# Patient Record
Sex: Male | Born: 1960 | Race: White | Hispanic: No | Marital: Married | Smoking: Former smoker
Health system: Southern US, Community
[De-identification: ages and names within clinical notes are randomized; demographics above are authoritative.]

## PROBLEM LIST (undated history)

## (undated) DIAGNOSIS — M1990 Unspecified osteoarthritis, unspecified site: Secondary | ICD-10-CM

## (undated) DIAGNOSIS — E0590 Thyrotoxicosis, unspecified without thyrotoxic crisis or storm: Secondary | ICD-10-CM

## (undated) DIAGNOSIS — I10 Essential (primary) hypertension: Secondary | ICD-10-CM

## (undated) DIAGNOSIS — K5909 Other constipation: Secondary | ICD-10-CM

## (undated) DIAGNOSIS — K5900 Constipation, unspecified: Secondary | ICD-10-CM

## (undated) HISTORY — DX: Thyrotoxicosis, unspecified without thyrotoxic crisis or storm: E05.90

## (undated) HISTORY — PX: FOOT SURGERY: SHX648

## (undated) HISTORY — PX: OTHER SURGICAL HISTORY: SHX169

## (undated) HISTORY — DX: Unspecified osteoarthritis, unspecified site: M19.90

## (undated) HISTORY — DX: Constipation, unspecified: K59.00

## (undated) HISTORY — DX: Essential (primary) hypertension: I10

## (undated) HISTORY — PX: UPPER GASTROINTESTINAL ENDOSCOPY: SHX188

## (undated) HISTORY — DX: Other constipation: K59.09

## (undated) HISTORY — PX: HIATAL HERNIA REPAIR: SHX195

## (undated) HISTORY — PX: ABDOMINAL HERNIA REPAIR: SHX539

---

## 1998-07-01 ENCOUNTER — Inpatient Hospital Stay (HOSPITAL_COMMUNITY): Admission: RE | Admit: 1998-07-01 | Discharge: 1998-07-02 | Payer: Self-pay | Admitting: Neurosurgery

## 2002-11-15 ENCOUNTER — Observation Stay (HOSPITAL_COMMUNITY): Admission: EM | Admit: 2002-11-15 | Discharge: 2002-11-16 | Payer: Self-pay | Admitting: Internal Medicine

## 2002-11-15 ENCOUNTER — Encounter: Payer: Self-pay | Admitting: *Deleted

## 2002-11-15 ENCOUNTER — Encounter: Payer: Self-pay | Admitting: Internal Medicine

## 2002-12-19 ENCOUNTER — Ambulatory Visit (HOSPITAL_COMMUNITY): Admission: RE | Admit: 2002-12-19 | Discharge: 2002-12-19 | Payer: Self-pay | Admitting: Internal Medicine

## 2003-03-13 ENCOUNTER — Ambulatory Visit (HOSPITAL_COMMUNITY): Admission: RE | Admit: 2003-03-13 | Discharge: 2003-03-13 | Payer: Self-pay | Admitting: Internal Medicine

## 2003-03-15 ENCOUNTER — Ambulatory Visit (HOSPITAL_COMMUNITY): Admission: RE | Admit: 2003-03-15 | Discharge: 2003-03-15 | Payer: Self-pay | Admitting: Internal Medicine

## 2003-04-02 ENCOUNTER — Ambulatory Visit (HOSPITAL_BASED_OUTPATIENT_CLINIC_OR_DEPARTMENT_OTHER): Admission: RE | Admit: 2003-04-02 | Discharge: 2003-04-02 | Payer: Self-pay | Admitting: Internal Medicine

## 2006-08-05 ENCOUNTER — Ambulatory Visit: Admission: RE | Admit: 2006-08-05 | Discharge: 2006-08-05 | Payer: Self-pay | Admitting: Orthopedic Surgery

## 2009-07-01 ENCOUNTER — Inpatient Hospital Stay (HOSPITAL_COMMUNITY): Admission: EM | Admit: 2009-07-01 | Discharge: 2009-07-05 | Payer: Self-pay | Admitting: Emergency Medicine

## 2009-11-03 ENCOUNTER — Ambulatory Visit (HOSPITAL_COMMUNITY): Admission: RE | Admit: 2009-11-03 | Discharge: 2009-11-03 | Payer: Self-pay | Admitting: Internal Medicine

## 2011-03-06 LAB — BASIC METABOLIC PANEL
BUN: 10 mg/dL (ref 6–23)
CO2: 25 mEq/L (ref 19–32)
Calcium: 9.2 mg/dL (ref 8.4–10.5)
GFR calc non Af Amer: >60 mL/min (ref >60)
Glucose, Bld: 84 mg/dL (ref 70–99)

## 2011-03-06 LAB — CBC
HCT: 42.9 % (ref 39.0–52.0)
HCT: 47.8 % (ref 39.0–52.0)
Hemoglobin: 16.1 g/dL (ref 13.0–17.0)
MCHC: 33.6 g/dL (ref 30.0–36.0)
MCHC: 33.9 g/dL (ref 30.0–36.0)
Platelets: 180 10*3/uL (ref 150–400)
Platelets: 197 10*3/uL (ref 150–400)
Platelets: 231 10*3/uL (ref 150–400)
RDW: 13.4 % (ref 11.5–15.5)
RDW: 13.7 % (ref 11.5–15.5)
RDW: 13.7 % (ref 11.5–15.5)
WBC: 13.3 10*3/uL — ABNORMAL HIGH (ref 4.0–10.5)

## 2011-03-06 LAB — CREATININE, SERUM
Creatinine, Ser: 0.99 mg/dL (ref 0.4–1.5)
GFR calc non Af Amer: >60 mL/min (ref >60)

## 2011-03-06 LAB — DIFFERENTIAL
Basophils Absolute: 0.1 10*3/uL (ref 0.0–0.1)
Basophils Relative: 1 % (ref 0–1)
Eosinophils Relative: 1 % (ref 0–5)
Lymphocytes Relative: 23 % (ref 12–46)
Monocytes Absolute: 0.8 10*3/uL (ref 0.1–1.0)
Neutro Abs: 6.4 10*3/uL (ref 1.7–7.7)

## 2011-03-06 LAB — POTASSIUM: Potassium: 4.2 mEq/L (ref 3.5–5.1)
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
## 2011-04-13 NOTE — Op Note (Signed)
[name] [name] NO.:  [id number]   MEDICAL RECORD NO.:  [id number]          PATIENT TYPE:  INP   LOCATION:  0098                         FACILITY:  [facility]   PHYSICIAN:  [name] Sportsman, MD     DATE OF BIRTH:  May 28, 1961   DATE OF PROCEDURE:  DATE OF DISCHARGE:                               OPERATIVE REPORT   PRIMARY CARE PHYSICIAN:  [name] Callander. Ouida Sills, MD, [name] [name].   SURGEON:  [name] Sportsman, MD.   ASSISTANT:  RN.   PREOPERATIVE DIAGNOSIS:  Incarcerated ventral hernia.   POSTOPERATIVE DIAGNOSES:  Incarcerated ventral hernia and a  periumbilical hernia as well, (5 x 11 cm region).   PROCEDURE PERFORMED:  1. Reduction of incarcerated ventral hernia.  2. Laparoscopic primary closure of ventral hernia.  3. Laparoscopic repair of ventral hernia using a 15 x 20 cm      Parietex/Seprafilm dual sided mesh.   ANESTHESIA:  1. General anesthesia.  2. Local anesthetic in a field block around all port sites and fascial      sites.   SPECIMENS:  None.   DRAINS:  None.   ESTIMATED BLOOD LOSS:  Thirty mL.   COMPLICATIONS:  None major.   INDICATIONS:  [name] [name] is a 50 year old morbidly obese male who has  had a supraumbilical ventral hernia for the past year.  It has increased  in size and has been coming more intermittently symptomatic.  He was  scheduled to have surgery and to be seen by Dr. Luretha Murphy later  this month in our group when he developed severe pain.  It persisted for  48 hours.  He followed up with his primary care physician.  Dr. Ouida [name]  was concerned about an unreducible hernia and, therefore, requested  surgical evaluation.  I was not able to reduce it myself.   The anatomy and embryology of the abdominal formation was discussed.  Pathophysiology of herniation was discussed.  Options discussed and  recommendations made for laparoscopic, possible open exploration with  reduction of hernia.  The possible need for bowel resection was  discussed.  The need for possible mesh reinforcement repair was  discussed.  The risks, benefits and alternatives discussed.  Questions  answered and he and his wife agreed to proceed.   OPERATIVE FINDINGS:  He had two ventral hernias, one periumbilical and  once supraumbilical for a total of 5 x 11 cm region.  The more cephalad  one was incarcerated with a large wad of omentum, but not strangulated.  The umbilical one was able to be reduced as well.  There was no evidence  of any intra-abdominal ischemia or necrosis.   DESCRIPTION OF PROCEDURE:  Informed consent was confirmed.  The patient  received IV cefazolin and Flagyl prior to incision.  He voided just  prior to going to the operating room.  He had sequential compression  devices active during the entire case.  He underwent general anesthesia  without any difficulty.  He was positioned supine with both arms tucked.  His abdomen was clipped, prepped and draped in a sterile fashion.  A  surgical timeout confirmed our plan.   I placed a #5 mm port in the left upper quadrant with the patient in  steep reversed Trendelenburg and left-side up.  A 5-mm port was placed  in the left flank and a 10-mm port was placed in the left lower  quadrant.   Camera inspection revealed a large swath of omentum going up into the  midline.  With insufflation and some careful palpation of it, I was  gradually able to get it reduced in through external pressure and  internal traction.  I inspected it and it was all omentum and although  the transverse colon was close to it, it was not actually involved  within it.  The colon had a little bit of inflammation, but showed no  evidence of any infection or necrosis.   I inspected the rest of the abdomen and there was no evidence of any  bowel obstruction or any other problems elsewhere.  I did notice a  hernia through the umbilicus inferiorly as well.  I took down the  falciform ligament since it was going  into the larger supraventricular  ventral hernia and freed it off from the umbilicus all the way to the  liver edge and removed it.   The hernia defect was measured.  I closed the supraventricular defect  using #1 Novofil interrupted stitches using a laparoscopic suture passer  by placing them and then tying them down with pressure released.  That  brought good approximation of the umbilical hernia.  The periumbilical  hernias, I did not more aggressively close since they were smaller.  I  chose a 50 x 20 cm mesh.  I tacked through the fascia of the anterior  abdominal wall using 10 interrupted #1 Novofil stitches using a  laparoscopic suture passer.  I used a tacker to tack the rim, as well as  the central part of the mesh to help pull it in place well.  This was  done with insufflation turned down to 10 mmHg.  Inspection revealed good  overlay with it nice and snug, but not over-tense.  I inspected the  omentum and it had perked up and looked much happier overall.  I closed  the 10 mm port using #1 Novofil interrupted stitches x2 using  laparoscopic suture passer.  Capnoperitoneum was evacuated and ports  removed.  The skin was closed using a 4-0 Monocryl stitch.  The smaller  puncture sites were closed using Steri-Strips.  Sterile dressings  applied.  The patient was extubated and sent to the recovery room in  stable condition.   I discussed postoperative care with the patient's wife.      [name] Sportsman, MD  Electronically Signed     SCG/MEDQ  D:  07/01/2009  T:  07/02/2009  Job:  [id number]   cc:   [name] Callander. Ouida Sills, MD  Fax: [phone]

## 2012-01-21 ENCOUNTER — Other Ambulatory Visit (INDEPENDENT_AMBULATORY_CARE_PROVIDER_SITE_OTHER): Payer: Self-pay | Admitting: *Deleted

## 2012-01-21 MED ORDER — LUBIPROSTONE 8 MCG PO CAPS: 8.0000 ug | ORAL_CAPSULE | Freq: Two times a day (BID) | ORAL | Status: AC

## 2012-01-21 NOTE — Telephone Encounter (Signed)
Patient has requested a refill on Amitiza 8 mcg capsule. Take 1 by mouth three times daily.

## 2012-10-04 ENCOUNTER — Ambulatory Visit (INDEPENDENT_AMBULATORY_CARE_PROVIDER_SITE_OTHER): Payer: BC Managed Care – PPO | Admitting: Internal Medicine

## 2012-10-04 ENCOUNTER — Encounter (INDEPENDENT_AMBULATORY_CARE_PROVIDER_SITE_OTHER): Payer: Self-pay | Admitting: Internal Medicine

## 2012-10-04 VITALS — BP 104/66 | HR 64 | Temp 98.2°F | Ht 75.0 in | Wt 325.9 lb

## 2012-10-04 DIAGNOSIS — K5900 Constipation, unspecified: Secondary | ICD-10-CM

## 2012-10-04 MED ORDER — LUBIPROSTONE 24 MCG PO CAPS: 24.0000 ug | ORAL_CAPSULE | Freq: Two times a day (BID) | ORAL | Status: DC

## 2012-10-04 NOTE — Patient Instructions (Addendum)
Continue present medication. OV in 1 yr. He will call when he is ready to have a colonoscopy

## 2012-10-04 NOTE — Progress Notes (Signed)
Subjective:     Patient ID: [name] [name], male   DOB: 16-Jan-1961, 51 y.o.   MRN: [id number]  HPI Here today for f/u of his constipation.  He tells me as long as he takes the Amitiza and MIialax he is not constipated. He has a BM daily.  He does not see blood or melena. Appetite is good.  No weight loss.  He is not exercising. He tells me he had a hernia repair in 2010 ago by Dr. Michaell Cowing.for an incarcerated ventral hernia.   Review of Systems see hpi Current Outpatient Prescriptions  Medication Sig Dispense Refill  . aspirin 81 MG tablet Take 81 mg by mouth daily.      . celecoxib (CELEBREX) 200 MG capsule Take 200 mg by mouth daily.      . cyclobenzaprine (FLEXERIL) 10 MG tablet Take 10 mg by mouth as needed.      . gabapentin (NEURONTIN) 100 MG capsule Take 100 mg by mouth at bedtime as needed and may repeat dose one time if needed.      . lubiprostone (AMITIZA) 8 MCG capsule Take 8 mcg by mouth 2 (two) times daily with a meal.      . TraMADol HCl 50 MG TBDP Take by mouth daily.      Marland Kitchen lubiprostone (AMITIZA) 24 MCG capsule Take 1 capsule (24 mcg total) by mouth 2 (two) times daily with a meal.  60 capsule  11   Past Medical History  Diagnosis Date  . Constipation    Past Surgical History  Procedure Date  . Knee surgeries   . Neck surgeries    History   Social History  . Marital Status: Married    Spouse Name: N/A    Number of Children: N/A  . Years of Education: N/A   Occupational History  . Not on file.   Social History Main Topics  . Smoking status: Never Smoker   . Smokeless tobacco: Not on file  . Alcohol Use: No  . Drug Use: No  . Sexually Active: Not on file   Other Topics Concern  . Not on file   Social History Narrative  . No narrative on file   Family Status  Relation Status Death Age  . Mother Deceased     unknown ? CVA or MI  . Father Deceased     Cirrhosis  . Sister Alive     unknown   Allergies  Allergen Reactions  . Morphine And Related          Objective:   Physical Exam Filed Vitals:   10/04/12 0953  BP: 104/66  Pulse: 64  Temp: 98.2 F (36.8 C)  Height: 6\' 3"  (1.905 m)  Weight: 325 lb 14.4 oz (147.827 kg)  Alert and oriented. Skin warm and dry. Oral mucosa is moist.   . Sclera anicteric, conjunctivae is pink. Thyroid not enlarged. No cervical lymphadenopathy. Lungs clear. Heart regular rate and rhythm.  Abdomen is soft. Bowel sounds are positive. No hepatomegaly. ? Mess felt at umblicus from ventral hernia repair.  felt. No tenderness.  No edema to lower extremities.        Assessment:    Constipation.  Much better with Amitiza and Miralax. Having a stool a day.     Plan:     OV in 1 yr. Continue present medications. He will call when he is ready to have a screening colonoscopy.

## 2012-10-05 ENCOUNTER — Other Ambulatory Visit (INDEPENDENT_AMBULATORY_CARE_PROVIDER_SITE_OTHER): Payer: Self-pay | Admitting: Internal Medicine

## 2012-12-25 ENCOUNTER — Telehealth (INDEPENDENT_AMBULATORY_CARE_PROVIDER_SITE_OTHER): Payer: Self-pay | Admitting: Internal Medicine

## 2012-12-25 DIAGNOSIS — K5900 Constipation, unspecified: Secondary | ICD-10-CM

## 2012-12-25 MED ORDER — LUBIPROSTONE 24 MCG PO CAPS: 24.0000 ug | ORAL_CAPSULE | Freq: Every day | ORAL | Status: DC

## 2012-12-25 MED ORDER — LUBIPROSTONE 8 MCG PO CAPS: 8.0000 ug | ORAL_CAPSULE | Freq: Two times a day (BID) | ORAL | Status: DC

## 2012-12-25 NOTE — Telephone Encounter (Signed)
States of Amitiza is working. Will reorder for him: eprescribed

## 2013-06-26 ENCOUNTER — Other Ambulatory Visit (INDEPENDENT_AMBULATORY_CARE_PROVIDER_SITE_OTHER): Payer: Self-pay | Admitting: Internal Medicine

## 2013-07-03 ENCOUNTER — Other Ambulatory Visit (HOSPITAL_COMMUNITY): Payer: Self-pay | Admitting: Internal Medicine

## 2013-07-03 ENCOUNTER — Ambulatory Visit (HOSPITAL_COMMUNITY)
Admission: RE | Admit: 2013-07-03 | Discharge: 2013-07-03 | Disposition: A | Discharge status: Home / Self Care | Payer: BC Managed Care – PPO | Source: Ambulatory Visit | Attending: Internal Medicine | Admitting: Internal Medicine

## 2013-07-03 DIAGNOSIS — R509 Fever, unspecified: Secondary | ICD-10-CM | POA: Insufficient documentation

## 2013-07-03 DIAGNOSIS — R05 Cough: Secondary | ICD-10-CM | POA: Insufficient documentation

## 2013-07-03 DIAGNOSIS — R059 Cough, unspecified: Secondary | ICD-10-CM | POA: Insufficient documentation

## 2013-10-08 ENCOUNTER — Encounter (INDEPENDENT_AMBULATORY_CARE_PROVIDER_SITE_OTHER): Payer: Self-pay | Admitting: Internal Medicine

## 2013-10-08 ENCOUNTER — Ambulatory Visit (INDEPENDENT_AMBULATORY_CARE_PROVIDER_SITE_OTHER): Payer: BC Managed Care – PPO | Admitting: Internal Medicine

## 2013-10-08 VITALS — BP 130/80 | HR 78 | Temp 97.7°F | Resp 18 | Ht 75.0 in | Wt 325.1 lb

## 2013-10-08 DIAGNOSIS — K5900 Constipation, unspecified: Secondary | ICD-10-CM

## 2013-10-08 DIAGNOSIS — M545 Low back pain, unspecified: Secondary | ICD-10-CM | POA: Insufficient documentation

## 2013-10-08 DIAGNOSIS — E669 Obesity, unspecified: Secondary | ICD-10-CM | POA: Insufficient documentation

## 2013-10-08 DIAGNOSIS — I10 Essential (primary) hypertension: Secondary | ICD-10-CM

## 2013-10-08 DIAGNOSIS — E039 Hypothyroidism, unspecified: Secondary | ICD-10-CM

## 2013-10-08 DIAGNOSIS — M170 Bilateral primary osteoarthritis of knee: Secondary | ICD-10-CM

## 2013-10-08 NOTE — Patient Instructions (Signed)
Consider regular exercise or swimming at least 3 times a week. Screening colonoscopy to be scheduled in February 2015.

## 2013-10-08 NOTE — Progress Notes (Signed)
Presenting complaint;  Followup for chronic constipation.  Subjective:  [name] [name] is 52 year old Caucasian male with chronic constipation and presents for yearly visit. He states medication is working. Every now and then he has to take a dose of MiraLax. He has noted that he becomes constipated when he's not very physically active and does not need the right kinds of foods. He remains with good appetite. His weight has not changed since his last visit one year ago. He denies melena or rectal bleeding. He continues to complain of bilateral knee pain as well as back pain. He has never been screened for colorectal carcinoma. He is not having any side effects with Amitiza.  Current Medications: Current Outpatient Prescriptions  Medication Sig Dispense Refill  . AMITIZA 8 MCG capsule TAKE 3 CAPSULES BY MOUTH IN THE MORNING AND 2 CAPSULES IN THE EVENING.  150 capsule  5  . aspirin 81 MG tablet Take 81 mg by mouth daily.      . celecoxib (CELEBREX) 200 MG capsule Take 200 mg by mouth daily.      . Cholecalciferol (VITAMIN D3) 5000 UNITS CAPS Take by mouth. Gel Cap - patient takes 2 by mouth daily for 5 days a week.      . cyclobenzaprine (FLEXERIL) 10 MG tablet Take 10 mg by mouth as needed.      . gabapentin (NEURONTIN) 100 MG capsule Take 200 mg by mouth at bedtime.       Marland Kitchen levothyroxine (SYNTHROID, LEVOTHROID) 112 MCG tablet Take 112 mcg by mouth daily before breakfast.      . polyethylene glycol (MIRALAX / GLYCOLAX) packet Take 17 g by mouth as needed.      Marland Kitchen PRESCRIPTION MEDICATION Patient states that he just started a blood pressure medication , takes daily.      . TraMADol HCl 50 MG TBDP Take by mouth daily.       No current facility-administered medications for this visit.     Objective: Blood pressure 130/80, pulse 78, temperature 97.7 F (36.5 C), temperature source Oral, resp. rate 18, height 6\' 3"  (1.905 m), weight 325 lb 1.6 oz (147.464 kg). Patient is alert and in no acute  distress. Conjunctiva is pink. Sclera is nonicteric Oropharyngeal mucosa is normal. No neck masses or thyromegaly noted. Cardiac exam with regular rhythm normal S1 and S2. No murmur or gallop noted. Lungs are clear to auscultation. Abdomen is protuberant, soft and nontender. He has hard subcutaneous round mass above the level of umbilicus in the midline(calcified hematoma resulting from hernia repair) Left lobe of the liver is easily palpable but it is nontender.  No LE edema or clubbing noted.    Assessment:  #1. Chronic constipation. He is doing well with therapy. Will continue with current therapy as long as it is working and he has no side effects. #2. Obesity. This condition puts patient at risk for liver disease. He must try to get it under control.  #3. Patient is average at risk for CRC. Marland Kitchen   Plan:  Continue amitiza at current dose. Continue high fiber. Patient encouraged to join [facility] or fitness club of his choice and must exercise on regular basis. Screening colonoscopy to be scheduled. Office visit in one year.

## 2013-12-29 ENCOUNTER — Other Ambulatory Visit (INDEPENDENT_AMBULATORY_CARE_PROVIDER_SITE_OTHER): Payer: Self-pay | Admitting: Internal Medicine

## 2014-01-01 ENCOUNTER — Encounter (INDEPENDENT_AMBULATORY_CARE_PROVIDER_SITE_OTHER): Payer: Self-pay | Admitting: *Deleted

## 2014-04-26 ENCOUNTER — Telehealth (INDEPENDENT_AMBULATORY_CARE_PROVIDER_SITE_OTHER): Payer: Self-pay | Admitting: *Deleted

## 2014-04-26 NOTE — Telephone Encounter (Signed)
We have done 2 PA request for the Amitza 8 mcg. [employer] states that no PA needed The sig read take 3 by mouth in the morning , 2 by mouth in the evening. I called the pharmacy spoke with Bonita Quin. [employer] will not pay for the [employer] ,and the patient is unable to use the discount card. His cost for half the prescription is $75. The patient states that he is using 24 mcg. He takes 3 by mouth in the morning , 2 in the evening. Excellent results. Prescription was changed to Amitza 24 mcg -Take 1 by mouth in the morning , and 1 by mouth in the evening #60 with 11 refills, per Urbano Heir @ [facility]. Patient will now be allowed to use the Amitza Card , co pay $35 a month.

## 2014-07-01 ENCOUNTER — Other Ambulatory Visit (INDEPENDENT_AMBULATORY_CARE_PROVIDER_SITE_OTHER): Payer: Self-pay | Admitting: Internal Medicine

## 2014-07-01 NOTE — Telephone Encounter (Signed)
Per Dr.Rehman may fill with 5 refills 

## 2014-07-17 ENCOUNTER — Encounter (INDEPENDENT_AMBULATORY_CARE_PROVIDER_SITE_OTHER): Payer: Self-pay | Admitting: *Deleted

## 2014-08-06 ENCOUNTER — Telehealth (INDEPENDENT_AMBULATORY_CARE_PROVIDER_SITE_OTHER): Payer: Self-pay | Admitting: *Deleted

## 2014-08-06 NOTE — Telephone Encounter (Addendum)
[name] [name] is needing a refill on his Amitiza. Would like it sent to his mail order pharmacy.  He is complete out and would like to know if we have any samples? The return phone number is [phone].

## 2014-08-06 NOTE — Telephone Encounter (Signed)
2 boxes of samples has been given to [place].

## 2014-08-07 ENCOUNTER — Other Ambulatory Visit (INDEPENDENT_AMBULATORY_CARE_PROVIDER_SITE_OTHER): Payer: Self-pay | Admitting: *Deleted

## 2014-08-07 MED ORDER — LUBIPROSTONE 24 MCG PO CAPS: ORAL_CAPSULE | ORAL | Status: DC

## 2014-08-07 NOTE — Telephone Encounter (Signed)
A refill request has been sent to Dr.Rehman. 

## 2014-08-07 NOTE — Telephone Encounter (Signed)
Patient called and states that he is desiring that the prescription for Amitiza 24 mcg be sent to mail order. This may be a 90 supply.

## 2014-08-20 ENCOUNTER — Telehealth (INDEPENDENT_AMBULATORY_CARE_PROVIDER_SITE_OTHER): Payer: Self-pay | Admitting: *Deleted

## 2014-08-20 NOTE — Telephone Encounter (Signed)
Patient will need to contact his Insurance Company to find out what is the cheapest. We do not know this. Patient was called and made aware.

## 2014-08-20 NOTE — Telephone Encounter (Signed)
The Amitiza is going to cost him $100 a month. Would like to see if there is something else Dr. Karilyn Cota could prescribe for him. The return phone number is [phone].

## 2014-08-23 NOTE — Telephone Encounter (Signed)
Forwarded to Dr.Rehman for review. 

## 2014-08-23 NOTE — Telephone Encounter (Signed)
[name] [name] was told by his insurance company that there are no other generic medicines covered. He should speak with his doctor. [name] [name] would like to know if Dr. Karilyn Cota could give him something else or should he schedule an apt sooner than November. His return phone number is [phone].

## 2014-08-26 NOTE — Telephone Encounter (Signed)
He could try lactulose 30 mL by mouth twice a day unless he has tried this in the past. Other option would be to take OTC laxative. Please call patient

## 2014-08-27 ENCOUNTER — Other Ambulatory Visit (INDEPENDENT_AMBULATORY_CARE_PROVIDER_SITE_OTHER): Payer: Self-pay | Admitting: *Deleted

## 2014-08-27 MED ORDER — LACTULOSE 10 GM/15ML PO SOLN: 20.0000 g | Freq: Two times a day (BID) | ORAL | Status: DC

## 2014-08-27 NOTE — Telephone Encounter (Signed)
Prescription request was sent to Dr.Rehman

## 2014-08-27 NOTE — Telephone Encounter (Signed)
Patient states that he would like to try the lactulose as recommended by Dr.Rehman.

## 2014-10-08 ENCOUNTER — Encounter (INDEPENDENT_AMBULATORY_CARE_PROVIDER_SITE_OTHER): Payer: Self-pay | Admitting: Internal Medicine

## 2014-10-08 ENCOUNTER — Telehealth (INDEPENDENT_AMBULATORY_CARE_PROVIDER_SITE_OTHER): Payer: Self-pay | Admitting: Internal Medicine

## 2014-10-08 ENCOUNTER — Ambulatory Visit (INDEPENDENT_AMBULATORY_CARE_PROVIDER_SITE_OTHER): Payer: PRIVATE HEALTH INSURANCE | Admitting: Internal Medicine

## 2014-10-08 VITALS — BP 120/66 | HR 76 | Temp 97.8°F | Ht 75.0 in | Wt 323.3 lb

## 2014-10-08 DIAGNOSIS — K5909 Other constipation: Secondary | ICD-10-CM

## 2014-10-08 DIAGNOSIS — K5900 Constipation, unspecified: Secondary | ICD-10-CM

## 2014-10-08 NOTE — Patient Instructions (Addendum)
Continue the Lactulose. Samples of Amitiza given to patient x 2 boxes.

## 2014-10-08 NOTE — Telephone Encounter (Signed)
error 

## 2014-10-08 NOTE — Progress Notes (Signed)
   Subjective:    Patient ID: [name] [name], male    DOB: Mar 28, 1961, 53 y.o.   MRN: [id number]  HPI [name] [name] today for f/u of his chronic constipation.  Presently taking Lactucose 30cc twice a day. He is having a BM at least every other day. He has actually stopped the Amitiza due to the expense. There has been no rectal bleeding. He is exercising on the job. Appetite is good. No weight loss. No melena or BRRB. Married with one child in good health. 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   Review of Systems Past Medical History  Diagnosis Date  . Constipation   . Chronic constipation   . Hypertension   . Hyperthyroidism     Past Surgical History  Procedure Laterality Date  . Knee surgeries    . Neck surgeries    . Abdominal hernia repair      Patient states that it was a double hernia surgery  . Upper gastrointestinal endoscopy  1980's  . Hiatal hernia repair      Allergies  Allergen Reactions  . Daypro [Oxaprozin] Other (See Comments)    Patient states that this medication caused bad abdominal cramping  . Morphine And Related     Current Outpatient Prescriptions on File Prior to Visit  Medication Sig Dispense Refill  . AMITIZA 8 MCG capsule TAKE 3 CAPSULES BY MOUTH IN THE MORNING AND 2 CAPSULES IN THE EVENING. 150 capsule 3  . aspirin 81 MG tablet Take 81 mg by mouth daily.    . celecoxib (CELEBREX) 200 MG capsule Take 200 mg by mouth daily.    . Cholecalciferol (VITAMIN D3) 5000 UNITS CAPS Take by mouth. One a day    . cyclobenzaprine (FLEXERIL) 10 MG tablet Take 10 mg by mouth as needed.    . gabapentin (NEURONTIN) 100 MG capsule Take 200 mg by mouth at bedtime.     Marland Kitchen. lactulose (CHRONULAC) 10 GM/15ML solution Take 30 mLs (20 g total) by mouth 2 (two) times daily. 960 mL 3  . levothyroxine (SYNTHROID, LEVOTHROID) 112 MCG tablet Take 112 mcg by mouth daily before breakfast.    . polyethylene glycol (MIRALAX / GLYCOLAX) packet Take 17 g by mouth as needed.     . TraMADol HCl 50 MG TBDP Take by mouth daily.    Marland Kitchen. PRESCRIPTION MEDICATION Patient states that he just started a blood pressure medication , takes daily.     No current facility-administered medications on file prior to visit.        Objective:   Physical Exam  Filed Vitals:   10/08/14 0911  Height: 6\' 3"  (1.905 m)  Weight: 323 lb 4.8 oz (146.648 kg)   Alert and oriented. Skin warm and dry. Oral mucosa is moist.   . Sclera anicteric, conjunctivae is pink. Thyroid not enlarged. No cervical lymphadenopathy. Lungs clear. Heart regular rate and rhythm.  Abdomen is soft. Bowel sounds are positive. No hepatomegaly. Firmness just above the umblicus. Patient is obese. No tenderness.  No edema to lower extremities.          Assessment & Plan:  Constipation, chronic. Presently taking Lactulose for his constipation with a BM every other day. In need of screening colonoscopy. Patient would like to defer colonoscopy. States he will let us know when he is ready. Encourage to exercise

## 2015-02-21 ENCOUNTER — Other Ambulatory Visit (INDEPENDENT_AMBULATORY_CARE_PROVIDER_SITE_OTHER): Payer: Self-pay | Admitting: Internal Medicine

## 2015-03-06 ENCOUNTER — Telehealth (INDEPENDENT_AMBULATORY_CARE_PROVIDER_SITE_OTHER): Payer: Self-pay | Admitting: *Deleted

## 2015-03-06 NOTE — Telephone Encounter (Signed)
Patient has been on Amitiza.Insurance will not cover this. They ask that patient try the Linzess before preceding with the PA for Amitiza. Per Dr.Rehman may give the patient sample of the Linzess 145 mcg. Patient has been called and made aware of how to take the medication.  Note only 1 box of the 145 mcg to sample, per Dr.Rehman may give samples of the . Instruct patient that if the 145 causes diarrhea he is not to take the 290. Patient was advised.

## 2015-03-08 IMAGING — CR DG CHEST 2V
3 series · 3 of 3 positions shown · non-contrast
Comparison: Acute abdominal series 07/01/2009.

CLINICAL DATA: Cough and fever.

CHEST - 2 VIEW

[view not recorded (1 of 3)]
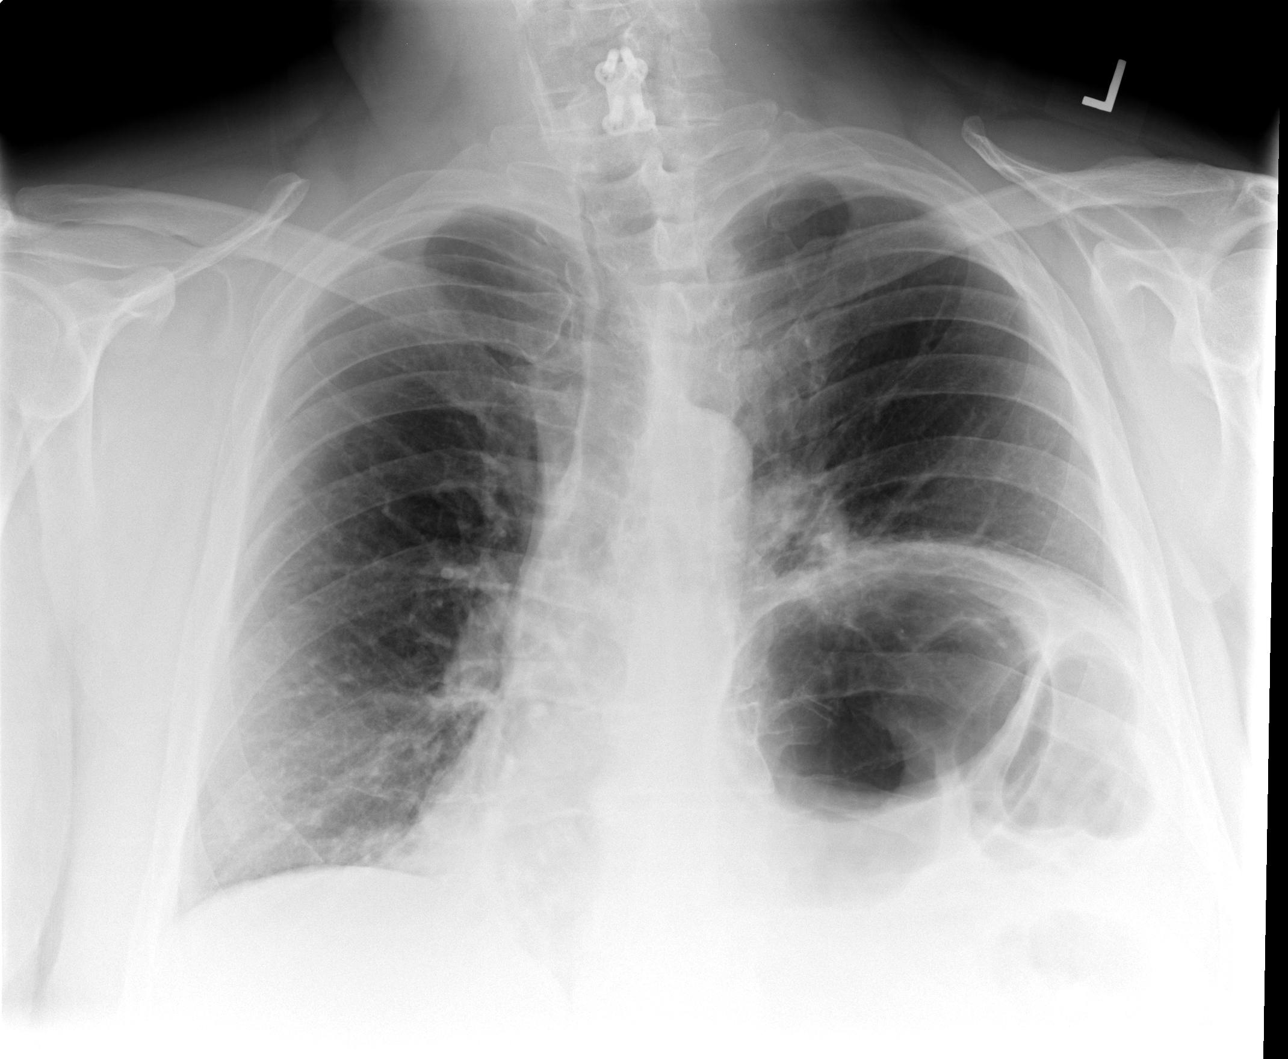

[view not recorded (2 of 3)]
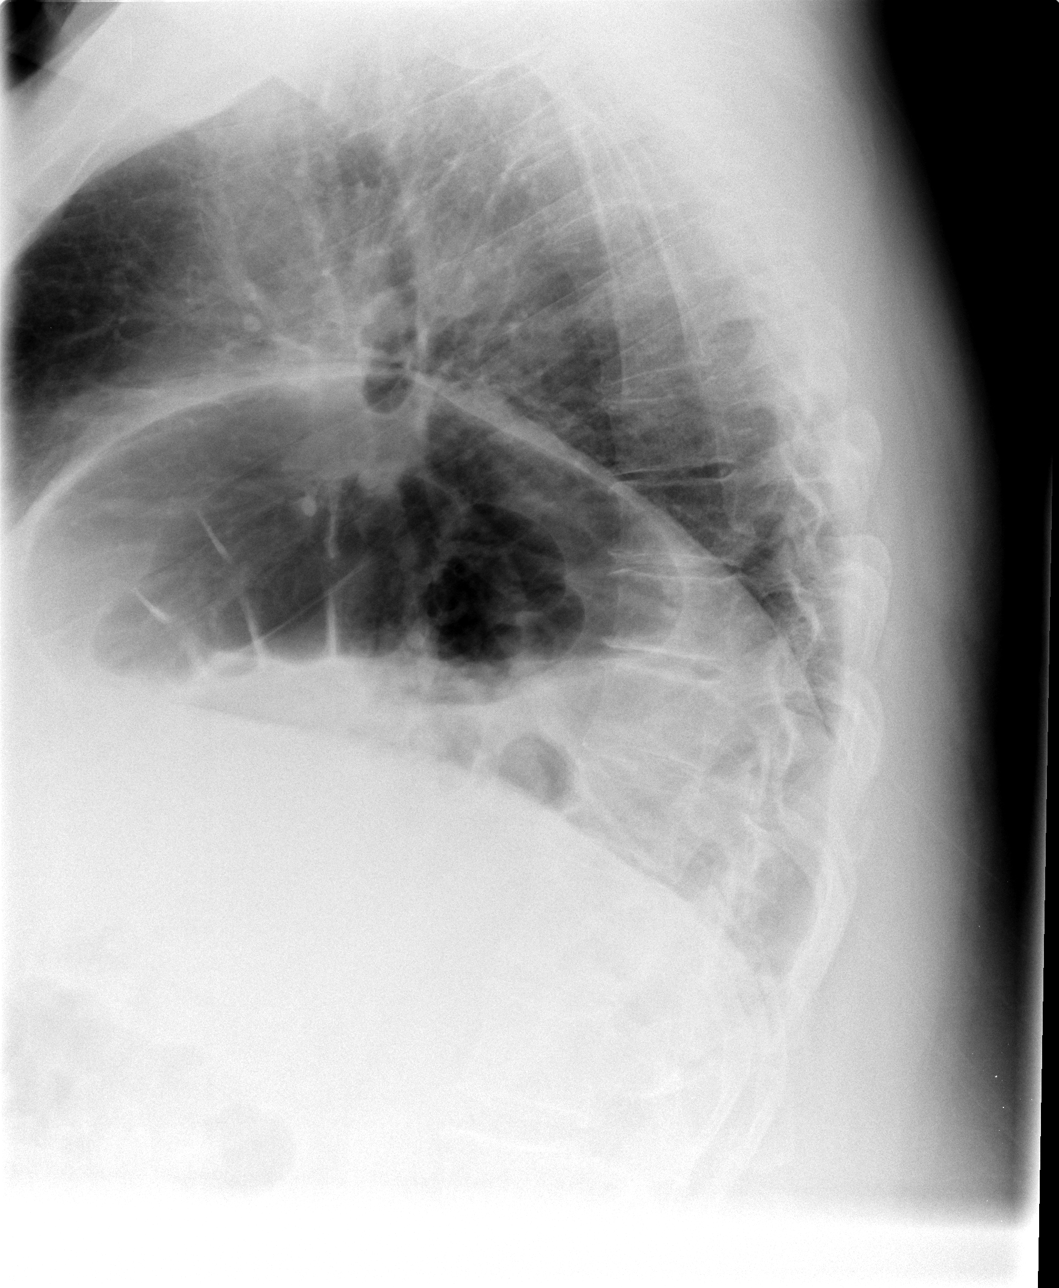

[view not recorded (3 of 3)]
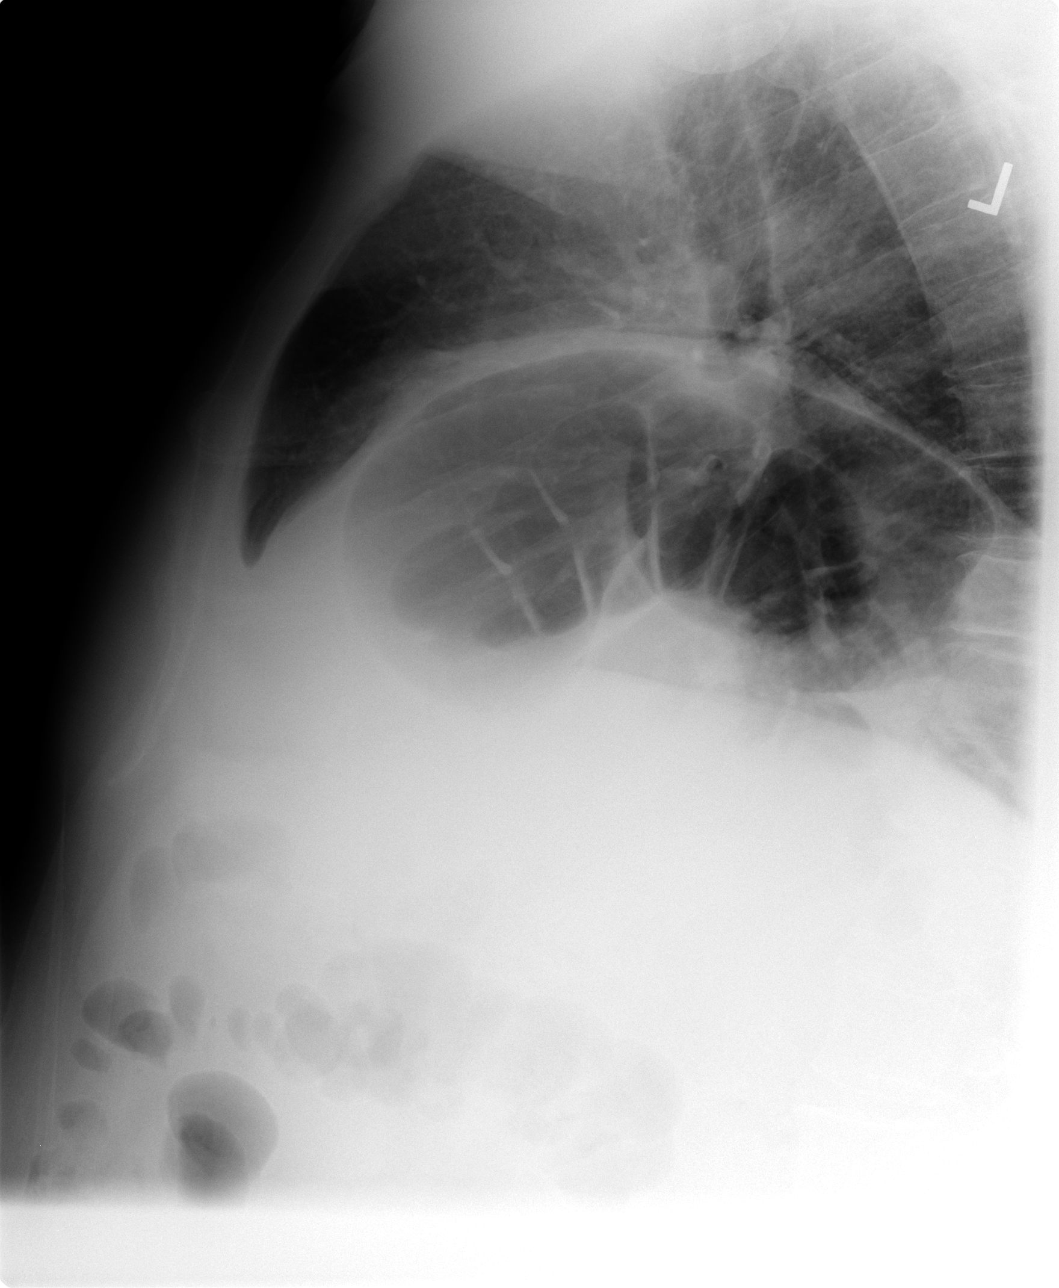

[3 of 3 positions shown; findings below may reference images not displayed]

FINDINGS: As with the prior examination there is marked elevation
of the left hemidiaphragm, with portions of the stomach and colon
projecting over the lower left hemithorax.  There is some
associated subsegmental atelectasis in the base of the left lung.
No definite consolidative airspace disease.  No pleural effusions.
No evidence of pulmonary edema.  Heart size is within normal
limits.  Cardiomediastinal contours are slightly shifted to the
right related to the left hemidiaphragm elevation (unchanged). The
patient is rotated to the right on today's exam, resulting in
distortion of the mediastinal contours and reduced diagnostic
sensitivity and specificity for mediastinal pathology.
Atherosclerosis in the thoracic aorta.  Orthopedic fixation
hardware in the lower cervical spine.
IMPRESSION: 1.  No radiographic evidence of acute cardiopulmonary disease.
2.  Chronic elevation of the left hemidiaphragm is unchanged.
3.  Atherosclerosis.

## 2015-04-15 ENCOUNTER — Encounter (INDEPENDENT_AMBULATORY_CARE_PROVIDER_SITE_OTHER): Payer: Self-pay | Admitting: Internal Medicine

## 2015-04-15 ENCOUNTER — Ambulatory Visit (INDEPENDENT_AMBULATORY_CARE_PROVIDER_SITE_OTHER): Payer: BLUE CROSS/BLUE SHIELD | Admitting: Internal Medicine

## 2015-04-15 VITALS — BP 134/72 | HR 76 | Temp 98.1°F | Ht 75.0 in | Wt 325.8 lb

## 2015-04-15 DIAGNOSIS — K5909 Other constipation: Secondary | ICD-10-CM

## 2015-04-15 MED ORDER — LINACLOTIDE 145 MCG PO CAPS: 145.0000 ug | ORAL_CAPSULE | Freq: Two times a day (BID) | ORAL | Status: DC

## 2015-04-15 NOTE — Patient Instructions (Signed)
Samples of Linzess every other day. Rx to his pharmacy

## 2015-04-15 NOTE — Progress Notes (Addendum)
Subjective:    Patient ID: [name] [name], male    DOB: October 19, 1961, 54 y.o.   MRN: [id number]  HPI Here today for f/u of his constipation . Has been maintained on Amitza which has worked but cannot afford this Rx  290.00 for a month's supply with insurance. He is here to explore other options. He is having a BM (partial) daily or every other day. She says he never fully is cleaned out.  No melena or BRRB. He says taking Amitiza once a day works but not completely.  He has tried Linzess which worked.  He says he really cannot take the lactulose because he doesn't know when he will have a BM. He continues to work at [employer]. His appetite is good. No weight loss. He has gained a pound. He has increased fiber in his diet.   Review of Systems Past Medical History  Diagnosis Date  . Constipation   . Chronic constipation   . Hypertension   . Hyperthyroidism     Past Surgical History  Procedure Laterality Date  . Knee surgeries    . Neck surgeries    . Abdominal hernia repair      Patient states that it was a double hernia surgery  . Upper gastrointestinal endoscopy  1980's  . Hiatal hernia repair      Allergies  Allergen Reactions  . Daypro [Oxaprozin] Other (See Comments)    Patient states that this medication caused bad abdominal cramping  . Morphine And Related     Current Outpatient Prescriptions on File Prior to Visit  Medication Sig Dispense Refill  . AMITIZA 24 MCG capsule TAKE ONE CAPSULE BY MOUTH TWICE A DAY (Patient taking differently: oncd a day) 60 capsule 5  . aspirin 81 MG tablet Take 81 mg by mouth daily.    . celecoxib (CELEBREX) 200 MG capsule Take 200 mg by mouth 2 (two) times daily.     . Cholecalciferol (VITAMIN D3) 5000 UNITS CAPS Take by mouth. One a day    . cyclobenzaprine (FLEXERIL) 10 MG tablet Take 10 mg by mouth as needed.    . gabapentin (NEURONTIN) 100 MG capsule Take 200 mg by mouth at bedtime.     Marland Kitchen. levothyroxine  (SYNTHROID, LEVOTHROID) 112 MCG tablet Take 112 mcg by mouth daily before breakfast.    . polyethylene glycol (MIRALAX / GLYCOLAX) packet Take 17 g by mouth as needed.    Marland Kitchen. PRESCRIPTION MEDICATION Patient states that he just started a blood pressure medication , takes daily.    . TraMADol HCl 50 MG TBDP Take by mouth daily.    Marland Kitchen. lactulose (CHRONULAC) 10 GM/15ML solution Take 30 mLs (20 g total) by mouth 2 (two) times daily. (Patient not taking: Reported on 04/15/2015) 960 mL 3   No current facility-administered medications on file prior to visit.        Objective:   Physical Exam Blood pressure 134/72, pulse 76, temperature 98.1 F (36.7 C), height 6\' 3"  (1.905 m), weight 325 lb 12.8 oz (147.782 kg).  Alert and oriented. Skin warm and dry. Oral mucosa is moist.   . Sclera anicteric, conjunctivae is pink. Thyroid not enlarged. No cervical lymphadenopathy. Lungs clear. Heart regular rate and rhythm.  Abdomen is soft. Bowel sounds are positive. No hepatomegaly. No abdominal masses felt. No tenderness.   Firm area just above the umbilicus which is a calcification from hernia surgery. No edema to lower extremities.  Assessment & Plan:  Am going to try Linzess every other day.  Samples of Linzess given to patient. Rx for same to see if his insurance will pay. OV in 3 months.

## 2015-07-16 ENCOUNTER — Ambulatory Visit (INDEPENDENT_AMBULATORY_CARE_PROVIDER_SITE_OTHER): Payer: PRIVATE HEALTH INSURANCE | Admitting: Internal Medicine

## 2015-07-16 ENCOUNTER — Encounter (INDEPENDENT_AMBULATORY_CARE_PROVIDER_SITE_OTHER): Payer: Self-pay | Admitting: Internal Medicine

## 2015-07-16 VITALS — BP 118/74 | HR 66 | Temp 97.9°F | Ht 75.0 in | Wt 321.4 lb

## 2015-07-16 DIAGNOSIS — K5909 Other constipation: Secondary | ICD-10-CM | POA: Diagnosis not present

## 2015-07-16 NOTE — Patient Instructions (Signed)
OV in 1 year. Continue the Linzess

## 2015-07-16 NOTE — Progress Notes (Signed)
   Subjective:    Patient ID: [name] [name], male    DOB: 05/11/1961, 54 y.o.   MRN: [id number]  HPI Here today for f/u of his constipation. He tells me he is doing good. Taking Linzess every other day. Usually has a BM daily. No melena or BRRB. Patient has never undergone a colonoscopy in the past.  His appetite has remained good. No weight loss. He denies any abdominal pain.  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    Review of Systems Past Medical History  Diagnosis Date  . Constipation   . Chronic constipation   . Hypertension   . Hyperthyroidism     Past Surgical History  Procedure Laterality Date  . Knee surgeries    . Neck surgeries    . Abdominal hernia repair      Patient states that it was a double hernia surgery  . Upper gastrointestinal endoscopy  1980's  . Hiatal hernia repair      Allergies  Allergen Reactions  . Daypro [Oxaprozin] Other (See Comments)    Patient states that this medication caused bad abdominal cramping  . Morphine And Related     Current Outpatient Prescriptions on File Prior to Visit  Medication Sig Dispense Refill  . aspirin 81 MG tablet Take 81 mg by mouth daily.    . celecoxib (CELEBREX) 200 MG capsule Take 200 mg by mouth 2 (two) times daily.     . Cholecalciferol (VITAMIN D3) 5000 UNITS CAPS Take by mouth. One a day    . gabapentin (NEURONTIN) 100 MG capsule Take 200 mg by mouth at bedtime.     Marland Kitchen levothyroxine (SYNTHROID, LEVOTHROID) 112 MCG tablet Take 112 mcg by mouth daily before breakfast.    . Linaclotide (LINZESS) 145 MCG CAPS capsule Take 1 capsule (145 mcg total) by mouth 2 (two) times daily. 60 capsule 3  . TraMADol HCl 50 MG TBDP Take by mouth daily.    . cyclobenzaprine (FLEXERIL) 10 MG tablet Take 10 mg by mouth as needed.     No current facility-administered medications on file prior to visit.        Objective:   Physical Exam Blood pressure 118/74, pulse 66, temperature 97.9 F  (36.6 C), height  (1.905 m), weight 321 lb 6.4 oz (145.786 kg). Alert and oriented. Skin warm and dry. Oral mucosa is moist.   . Sclera anicteric, conjunctivae is pink. Thyroid not enlarged. No cervical lymphadenopathy. Lungs clear. Heart regular rate and rhythm.  Abdomen is soft. Bowel sounds are positive. No hepatomegaly. Mass just above umbilicus which he says is a calcification from previous hernia surgery. . No tenderness.  No edema to lower extremities.         Assessment & Plan:  Constipation. Much better since taking the Linzess every other day. OV in 1 year. He will let our office know when he is ready for a screening colonoscopy.

## 2016-04-08 ENCOUNTER — Encounter (INDEPENDENT_AMBULATORY_CARE_PROVIDER_SITE_OTHER): Payer: Self-pay | Admitting: Internal Medicine

## 2016-06-29 ENCOUNTER — Encounter (INDEPENDENT_AMBULATORY_CARE_PROVIDER_SITE_OTHER): Payer: Self-pay | Admitting: Internal Medicine

## 2016-06-29 ENCOUNTER — Ambulatory Visit (INDEPENDENT_AMBULATORY_CARE_PROVIDER_SITE_OTHER): Payer: BLUE CROSS/BLUE SHIELD | Admitting: Internal Medicine

## 2016-06-29 VITALS — BP 152/80 | HR 76 | Temp 97.6°F | Ht 75.0 in | Wt 317.0 lb

## 2016-06-29 DIAGNOSIS — K5909 Other constipation: Secondary | ICD-10-CM

## 2016-06-29 NOTE — Progress Notes (Signed)
   Subjective:    Patient ID: [name] [name], male    DOB: 04/27/1961, 55 y.o.   MRN: [id number]  HPI Here today for f/u of his constipation. Last seen in August of 2016. Weight 06/2015 321. He tells me he is doing good. He usually has a BM daily or every other day. Takes LInzess every other day. His appetite is good. No melena or BRRB. He is staying busy.   Review of Systems Past Medical History:  Diagnosis Date  . Chronic constipation   . Constipation   . Hypertension   . Hyperthyroidism     Past Surgical History:  Procedure Laterality Date  . ABDOMINAL HERNIA REPAIR     Patient states that it was a double hernia surgery  . HIATAL HERNIA REPAIR    . knee surgeries    . neck surgeries    . UPPER GASTROINTESTINAL ENDOSCOPY  1980's    Allergies  Allergen Reactions  . Daypro [Oxaprozin] Other (See Comments)    Patient states that this medication caused bad abdominal cramping  . Morphine And Related     Current Outpatient Prescriptions on File Prior to Visit  Medication Sig Dispense Refill  . celecoxib (CELEBREX) 200 MG capsule Take 200 mg by mouth 2 (two) times daily.     . Cholecalciferol (VITAMIN D3) 5000 UNITS CAPS Take by mouth. One a day    . cyclobenzaprine (FLEXERIL) 10 MG tablet Take 10 mg by mouth as needed.    . gabapentin (NEURONTIN) 100 MG capsule Take 200 mg by mouth at bedtime.     Marland Kitchen levothyroxine (SYNTHROID, LEVOTHROID) 112 MCG tablet Take 112 mcg by mouth daily before breakfast.    . Linaclotide (LINZESS) 145 MCG CAPS capsule Take 1 capsule (145 mcg total) by mouth 2 (two) times daily. (Patient taking differently: Take 145 mcg by mouth every other day. ) 60 capsule 3  . aspirin 81 MG tablet Take 81 mg by mouth daily.    . TraMADol HCl 50 MG TBDP Take by mouth daily.     No current facility-administered medications on file prior to visit.        Objective:   Physical Exam Blood pressure (!) 152/80, pulse 76, temperature 97.6 F (36.4 C), height 6\' 3"   (1.905 m), weight (!) 317 lb (143.8 kg).  Alert and oriented. Skin warm and dry. Oral mucosa is moist.   . Sclera anicteric, conjunctivae is pink. Thyroid not enlarged. No cervical lymphadenopathy. Lungs clear. Heart regular rate and rhythm.  Abdomen is soft. Bowel sounds are positive. No hepatomegaly. No abdominal masses felt. No tenderness.  No edema to lower extremities.       Assessment & Plan:  Constipation. Doing well. Has BM every other day.  He will let our office know when he is ready for a colonoscopy

## 2016-06-29 NOTE — Patient Instructions (Signed)
Continue the LInzess. OV in 1year.

## 2016-07-28 ENCOUNTER — Other Ambulatory Visit (INDEPENDENT_AMBULATORY_CARE_PROVIDER_SITE_OTHER): Payer: Self-pay | Admitting: Internal Medicine

## 2016-07-28 DIAGNOSIS — K5909 Other constipation: Secondary | ICD-10-CM

## 2016-12-24 DIAGNOSIS — M47816 Spondylosis without myelopathy or radiculopathy, lumbar region: Secondary | ICD-10-CM | POA: Insufficient documentation

## 2016-12-24 DIAGNOSIS — M47812 Spondylosis without myelopathy or radiculopathy, cervical region: Secondary | ICD-10-CM | POA: Insufficient documentation

## 2016-12-24 DIAGNOSIS — M170 Bilateral primary osteoarthritis of knee: Secondary | ICD-10-CM | POA: Insufficient documentation

## 2016-12-24 NOTE — Progress Notes (Deleted)
   Office Visit Note  Patient: [name] [name]             Date of Birth: Feb 13, 1961           MRN: [id number]             PCP: Asencion Noble, MD Referring: Asencion Noble, MD Visit Date: 12/28/2016 Occupation: Retired Adult nurse    Subjective:  No chief complaint on file.   History of Present Illness: [name] [name] is a 56 y.o. male ***   Activities of Daily Living:  Patient reports morning stiffness for *** {minute/hour:19697}.   Patient {ACTIONS;DENIES/REPORTS:21021675::"Denies"} nocturnal pain.  Difficulty dressing/grooming: {ACTIONS;DENIES/REPORTS:21021675::"Denies"} Difficulty climbing stairs: {ACTIONS;DENIES/REPORTS:21021675::"Denies"} Difficulty getting out of chair: {ACTIONS;DENIES/REPORTS:21021675::"Denies"} Difficulty using hands for taps, buttons, cutlery, and/or writing: {ACTIONS;DENIES/REPORTS:21021675::"Denies"}   No Rheumatology ROS completed.   PMFS History:  Patient Active Problem List   Diagnosis Date Noted  . Primary osteoarthritis of both knees 12/24/2016  . Spondylosis of lumbar region without myelopathy or radiculopathy 12/24/2016  . DJD (degenerative joint disease), cervical 12/24/2016  . Obesity 10/08/2013  . Arthritis of both knees 10/08/2013  . LBP (low back pain) 10/08/2013  . HTN (hypertension) 10/08/2013  . Hypothyroidism 10/08/2013  . Constipation 10/04/2012    Past Medical History:  Diagnosis Date  . Chronic constipation   . Constipation   . Hypertension   . Hyperthyroidism     No family history on file. Past Surgical History:  Procedure Laterality Date  . ABDOMINAL HERNIA REPAIR     Patient states that it was a double hernia surgery  . HIATAL HERNIA REPAIR    . knee surgeries    . neck surgeries    . UPPER GASTROINTESTINAL ENDOSCOPY  1980's   Social History   Social History Narrative  . No narrative on file     Objective: Vital Signs: There were no vitals taken for this visit.   Physical Exam   Musculoskeletal  Exam: ***  CDAI Exam: No CDAI exam completed.    Investigation: Findings:  His labs from April 2009 visit showed CBC with diff and comprehensive metabolic panel were normal.  CPK was 199.  TSH was 6.205. Uric acid was 81.  Rheumatoid factor, ANA, and CCP antibody were negative and HLA-B27 was negative.    12/27/2013   X-rays of the bilateral knees today show worsening of the moderate medial compartment narrowing.  He also has mild patellofemoral joint space narrowing bilaterally.   09/29/2015 CMP normal, CBC normal, triglycerides 165, TSH normal, PSA normal    Imaging: No results found.  Speciality Comments: No specialty comments available.    Procedures:  No procedures performed Allergies: Daypro [oxaprozin] and Morphine and related   Assessment / Plan:     Visit Diagnoses: Primary osteoarthritis of both knees  Spondylosis of lumbar region without myelopathy or radiculopathy  DJD (degenerative joint disease), cervical    Orders: No orders of the defined types were placed in this encounter.  No orders of the defined types were placed in this encounter.   Face-to-face time spent with patient was *** minutes. 50% of time was spent in counseling and coordination of care.  Follow-Up Instructions: No Follow-up on file.   Amy Littrell, RT  Note - This record has been created using [employer].  Chart creation errors have been sought, but may not always  have been located. Such creation errors do not reflect on  the standard of medical care.

## 2016-12-25 DIAGNOSIS — M792 Neuralgia and neuritis, unspecified: Secondary | ICD-10-CM | POA: Insufficient documentation

## 2016-12-25 DIAGNOSIS — M19042 Primary osteoarthritis, left hand: Secondary | ICD-10-CM | POA: Insufficient documentation

## 2016-12-25 DIAGNOSIS — M19041 Primary osteoarthritis, right hand: Secondary | ICD-10-CM | POA: Insufficient documentation

## 2016-12-25 DIAGNOSIS — K219 Gastro-esophageal reflux disease without esophagitis: Secondary | ICD-10-CM | POA: Insufficient documentation

## 2016-12-28 ENCOUNTER — Ambulatory Visit: Payer: Self-pay | Admitting: Rheumatology

## 2016-12-28 ENCOUNTER — Encounter: Payer: Self-pay | Admitting: Rheumatology

## 2016-12-28 ENCOUNTER — Telehealth: Payer: Self-pay | Admitting: Rheumatology

## 2016-12-28 ENCOUNTER — Ambulatory Visit (INDEPENDENT_AMBULATORY_CARE_PROVIDER_SITE_OTHER): Payer: Worker's Compensation | Admitting: Rheumatology

## 2016-12-28 VITALS — BP 146/82 | HR 74 | Resp 13 | Ht 75.0 in | Wt 314.0 lb

## 2016-12-28 DIAGNOSIS — Z9109 Other allergy status, other than to drugs and biological substances: Secondary | ICD-10-CM | POA: Insufficient documentation

## 2016-12-28 DIAGNOSIS — E039 Hypothyroidism, unspecified: Secondary | ICD-10-CM

## 2016-12-28 DIAGNOSIS — M19042 Primary osteoarthritis, left hand: Secondary | ICD-10-CM

## 2016-12-28 DIAGNOSIS — E6609 Other obesity due to excess calories: Secondary | ICD-10-CM

## 2016-12-28 DIAGNOSIS — M19041 Primary osteoarthritis, right hand: Secondary | ICD-10-CM | POA: Diagnosis not present

## 2016-12-28 DIAGNOSIS — M5030 Other cervical disc degeneration, unspecified cervical region: Secondary | ICD-10-CM | POA: Diagnosis not present

## 2016-12-28 DIAGNOSIS — K219 Gastro-esophageal reflux disease without esophagitis: Secondary | ICD-10-CM

## 2016-12-28 DIAGNOSIS — M170 Bilateral primary osteoarthritis of knee: Secondary | ICD-10-CM

## 2016-12-28 DIAGNOSIS — M47816 Spondylosis without myelopathy or radiculopathy, lumbar region: Secondary | ICD-10-CM

## 2016-12-28 DIAGNOSIS — M47812 Spondylosis without myelopathy or radiculopathy, cervical region: Secondary | ICD-10-CM

## 2016-12-28 DIAGNOSIS — Z6839 Body mass index (BMI) 39.0-39.9, adult: Secondary | ICD-10-CM

## 2016-12-28 DIAGNOSIS — E119 Type 2 diabetes mellitus without complications: Secondary | ICD-10-CM | POA: Diagnosis not present

## 2016-12-28 DIAGNOSIS — M792 Neuralgia and neuritis, unspecified: Secondary | ICD-10-CM

## 2016-12-28 NOTE — Progress Notes (Signed)
Office Visit Note  Patient: [name] [name]             Date of Birth: 07-Nov-1961           MRN: [id number]             PCP: Carylon PerchesFAGAN,ROY, MD Referring: Carylon PerchesFagan, Roy, MD Visit Date: 12/28/2016 Occupation: Geophysical data processorConstruction manager    Subjective: Knee pain   History of Present Illness: [name] [name] is a 56 y.o. male with history of osteoarthritis and disc disease. He continues to have pain in his bilateral knee joints in his hands. He has discomfort in his C-spine and lumbar spine after doing certain activities. He has intermittent swelling in his knee joints. He states he was recently diagnosed with diabetes and started taking metformin.  Activities of Daily Living:  Patient reports morning stiffness for 0 minute.   Patient Denies nocturnal pain.  Difficulty dressing/grooming: Denies Difficulty climbing stairs: Reports Difficulty getting out of chair: Reports Difficulty using hands for taps, buttons, cutlery, and/or writing: Reports   Review of Systems  Constitutional: Positive for weight loss. Negative for fatigue, night sweats and weakness ( ).       Intensional weight loss  HENT: Negative for mouth sores, mouth dryness and nose dryness.   Eyes: Negative for redness and dryness.  Respiratory: Negative for shortness of breath and difficulty breathing.   Cardiovascular: Negative for chest pain, palpitations, hypertension, irregular heartbeat and swelling in legs/feet.  Gastrointestinal: Negative for constipation and diarrhea.  Endocrine: Negative for increased urination.  Musculoskeletal: Positive for arthralgias, joint pain and morning stiffness. Negative for joint swelling, myalgias, muscle weakness, muscle tenderness and myalgias.  Skin: Negative for color change, rash, hair loss, nodules/bumps, skin tightness, ulcers and sensitivity to sunlight.  Allergic/Immunologic: Negative for susceptible to infections.  Neurological: Negative for dizziness, fainting, memory loss and night  sweats.  Hematological: Negative for swollen glands.  Psychiatric/Behavioral: Negative for depressed mood. The patient is nervous/anxious.     PMFS History:  Patient Active Problem List   Diagnosis Date Noted  . History of environmental allergies 12/28/2016  . Type 2 diabetes mellitus without complication, without long-term current use of insulin (HCC) 12/28/2016  . Primary osteoarthritis of both hands 12/25/2016  . Gastroesophageal reflux disease without esophagitis 12/25/2016  . Neuralgia 12/25/2016  . Primary osteoarthritis of both knees 12/24/2016  . Spondylosis of lumbar region without myelopathy or radiculopathy 12/24/2016  . DJD (degenerative joint disease), cervical 12/24/2016  . Obesity 10/08/2013  . Arthritis of both knees 10/08/2013  . LBP (low back pain) 10/08/2013  . HTN (hypertension) 10/08/2013  . Hypothyroidism 10/08/2013  . Constipation 10/04/2012    Past Medical History:  Diagnosis Date  . Arthritis   . Chronic constipation   . Constipation   . Hypertension   . Hyperthyroidism     History reviewed. No pertinent family history. Past Surgical History:  Procedure Laterality Date  . ABDOMINAL HERNIA REPAIR     Patient states that it was a double hernia surgery  . FOOT SURGERY    . HIATAL HERNIA REPAIR    . knee surgeries    . neck surgeries    . UPPER GASTROINTESTINAL ENDOSCOPY  1980's   Social History   Social History Narrative  . No narrative on file     Objective: Vital Signs: BP (!) 146/82 (BP Location: Left Arm, Patient Position: Sitting, Cuff Size: Normal)   Pulse 74   Resp 13   Ht 6\' 3"  (1.905 m)  Wt (!) 314 lb (142.4 kg)   BMI 39.25 kg/m    Physical Exam  Constitutional: He is oriented to person, place, and time. He appears well-developed and well-nourished.  HENT:  Head: Normocephalic and atraumatic.  Eyes: Conjunctivae and EOM are normal. Pupils are equal, round, and reactive to light.  Neck: Normal range of motion. Neck supple.    Cardiovascular: Normal rate, regular rhythm and normal heart sounds.   Pulmonary/Chest: Effort normal and breath sounds normal.  Abdominal: Soft. Bowel sounds are normal.  Neurological: He is alert and oriented to person, place, and time.  Skin: Skin is warm and dry. Capillary refill takes less than 2 seconds.  Psychiatric: He has a normal mood and affect. His behavior is normal.  Nursing note and vitals reviewed.    Musculoskeletal Exam: C-spine, thoracic, lumbar spine discomfort with range of motion. He has limitation of range of motion of his lumbar spine. Shoulder joints elbow joints wrist joints are good range of motion he has thickening of PIP/DIP joints in his hands, with no synovitis. Hip joints, knee joints some discomfort with range of motion. He has crepitus in his bilateral knee joints. Without any warmth swelling or effusion.  CDAI Exam: No CDAI exam completed.    Investigation: Findings:  09/29/2015 CBC normal, CMP normal, LDL 19, TSH normal, PSA normal    Imaging: No results found.  Speciality Comments: No specialty comments available.    Procedures:  No procedures performed Allergies: Daypro [oxaprozin] and Morphine and related   Assessment / Plan:     Visit Diagnoses: Arthritis of both knees: He continues to have pain and discomfort in his bilateral knee joints. The Celebrex does help. Side effects of long-term use of Celebrex were reviewed. He will get CBC and CMP with his PCP and will forward the results to me.  Primary osteoarthritis of both hands: Joint protection and muscle strengthening discussed.  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 Flexeril does help.  Obesity: Weight loss diet and exercise was discussed at length.  He is been diagnosed with type 2 diabetes recently and started metformin.  Neuralgia: He is on gabapentin which is  helpful. I'll refill as needed.  Gastroesophageal reflux disease without esophagitis  Acquired hypothyroidism  History of environmental allergies    Orders: No orders of the defined types were placed in this encounter.  No orders of the defined types were placed in this encounter.   Face-to-face time spent with patient was 30 minutes. 50% of time was spent in counseling and coordination of care.  Follow-Up Instructions: Return in about 6 months (around 06/27/2017) for Osteoarthritis.   Pollyann Savoy, MD  Note - This record has been created using [employer].  Chart creation errors have been sought, but may not always  have been located. Such creation errors do not reflect on  the standard of medical care.

## 2016-12-28 NOTE — Telephone Encounter (Signed)
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 I updated everything using the information in the old system. Please look over this and make sure I completed this correctly.  Thank you

## 2017-01-03 ENCOUNTER — Other Ambulatory Visit: Payer: Self-pay | Admitting: Rheumatology

## 2017-01-04 LAB — CBC/DIFF AMBIGUOUS DEFAULT
BASOS ABS: 0 10*3/uL (ref 0.0–0.2)
Basos: 0 %
EOS (ABSOLUTE): 0.1 10*3/uL (ref 0.0–0.4)
Eos: 1 %
Hematocrit: 44 % (ref 37.5–51.0)
Hemoglobin: 14.2 g/dL (ref 13.0–17.7)
Immature Grans (Abs): 0 10*3/uL (ref 0.0–0.1)
Immature Granulocytes: 0 %
LYMPHS ABS: 2.3 10*3/uL (ref 0.7–3.1)
LYMPHS: 32 %
MCH: 29.1 pg (ref 26.6–33.0)
MCHC: 32.3 g/dL (ref 31.5–35.7)
MCV: 90 fL (ref 79–97)
MONOS ABS: 0.6 10*3/uL (ref 0.1–0.9)
Monocytes: 9 %
NEUTROS ABS: 4.1 10*3/uL (ref 1.4–7.0)
Neutrophils: 58 %
PLATELETS: 209 10*3/uL (ref 150–379)
RBC: 4.88 x10E6/uL (ref 4.14–5.80)
RDW: 13.4 % (ref 12.3–15.4)
WBC: 7 10*3/uL (ref 3.4–10.8)

## 2017-01-04 LAB — COMPREHENSIVE METABOLIC PANEL
ALK PHOS: 71 IU/L (ref 39–117)
ALT: 40 IU/L (ref 0–44)
AST: 24 IU/L (ref 0–40)
Albumin/Globulin Ratio: 2 (ref 1.2–2.2)
Albumin: 4.5 g/dL (ref 3.5–5.5)
BILIRUBIN TOTAL: 0.4 mg/dL (ref 0.0–1.2)
BUN/Creatinine Ratio: 12 (ref 9–20)
BUN: 12 mg/dL (ref 6–24)
CHLORIDE: 108 mmol/L — AB (ref 96–106)
CO2: 22 mmol/L (ref 18–29)
Calcium: 9.3 mg/dL (ref 8.7–10.2)
Creatinine, Ser: 0.97 mg/dL (ref 0.76–1.27)
GFR calc Af Amer: 100 mL/min/{1.73_m2} (ref >59)
GFR calc non Af Amer: 87 mL/min/{1.73_m2} (ref >59)
GLUCOSE: 138 mg/dL — AB (ref 65–99)
Globulin, Total: 2.2 g/dL (ref 1.5–4.5)
POTASSIUM: 4.3 mmol/L (ref 3.5–5.2)
Sodium: 146 mmol/L — ABNORMAL HIGH (ref 134–144)
Total Protein: 6.7 g/dL (ref 6.0–8.5)

## 2017-01-04 LAB — AMBIG ABBREV CMP14 DEFAULT

## 2017-01-05 ENCOUNTER — Other Ambulatory Visit: Payer: Self-pay | Admitting: Rheumatology

## 2017-01-06 NOTE — Telephone Encounter (Signed)
Last Visit: 12/28/16 Next Visit: 06/27/17  Okay to refill Voltaren Gel?

## 2017-02-28 ENCOUNTER — Other Ambulatory Visit: Payer: Self-pay | Admitting: Rheumatology

## 2017-03-01 NOTE — Telephone Encounter (Signed)
ok 

## 2017-03-01 NOTE — Telephone Encounter (Signed)
Last Visit: 12/28/16 Next Visit: 06/27/17  Okay to refill Gabapentin?

## 2017-03-29 DIAGNOSIS — R2689 Other abnormalities of gait and mobility: Secondary | ICD-10-CM | POA: Insufficient documentation

## 2017-05-30 ENCOUNTER — Encounter (INDEPENDENT_AMBULATORY_CARE_PROVIDER_SITE_OTHER): Payer: Self-pay | Admitting: Internal Medicine

## 2017-06-22 NOTE — Progress Notes (Signed)
Office Visit Note  Patient: [name] [name]             Date of Birth: [birth year]           MRN: [id number]             PCP: Carylon PerchesFagan, Roy, MD Referring: Carylon PerchesFagan, Roy, MD Visit Date: 06/23/2017 Occupation: @GUAROCC @    Subjective:  Follow-up (follow up for joint pain, related from accident in 1993, no flare up just constant pain)   History of Present Illness: [name] [name] is a 56 y.o. male  Last seen Dec 28, 2016 for OA, & disc disease.  On the last visit, he was complaining that he had ongoing bilateral knee pain, hand pain. He is also complaining of discomfort in his C-spine and lumbar spine with certain activities. He had intermittent swelling in his knee joints. He was also recently diagnosed with diabetes and was started on metformin.  Today, patient states that he is about the same today as he was on the last visit. C/o ongoing knee pain. oa pain in hands, feet continue to be a source of discomfort for the patient but tolerable.  We have discussed Visco supplementation in the past with patient was fearful of needles and wants to avoid pushups for now. We reassured him that the injections that we can for the knees should be tolerable and patient will consider that.  Activities of Daily Living:  Patient reports morning stiffness for 30 minutes.   Patient Reports nocturnal pain.  Difficulty dressing/grooming: Denies Difficulty climbing stairs: Reports Difficulty getting out of chair: Reports Difficulty using hands for taps, buttons, cutlery, and/or writing: Reports   Review of Systems  Constitutional: Negative for fatigue.  HENT: Negative for mouth sores and mouth dryness.   Eyes: Negative for dryness.  Respiratory: Negative for shortness of breath.   Gastrointestinal: Negative for constipation and diarrhea.  Musculoskeletal: Negative for myalgias and myalgias.  Skin: Negative for sensitivity to sunlight.  Neurological: Negative for memory loss.  Psychiatric/Behavioral:  Negative for sleep disturbance.    PMFS History:  Patient Active Problem List   Diagnosis Date Noted  . History of environmental allergies 12/28/2016  . Type 2 diabetes mellitus without complication, without long-term current use of insulin (HCC) 12/28/2016  . Primary osteoarthritis of both hands 12/25/2016  . Gastroesophageal reflux disease without esophagitis 12/25/2016  . Neuralgia 12/25/2016  . Primary osteoarthritis of both knees 12/24/2016  . Spondylosis of lumbar region without myelopathy or radiculopathy 12/24/2016  . DJD (degenerative joint disease), cervical 12/24/2016  . Obesity 10/08/2013  . Arthritis of both knees 10/08/2013  . LBP (low back pain) 10/08/2013  . HTN (hypertension) 10/08/2013  . Hypothyroidism 10/08/2013  . Constipation 10/04/2012    Past Medical History:  Diagnosis Date  . Arthritis   . Chronic constipation   . Constipation   . Hypertension   . Hyperthyroidism     Family History  Problem Relation Age of Onset  . Lung disease Sister    Past Surgical History:  Procedure Laterality Date  . ABDOMINAL HERNIA REPAIR     Patient states that it was a double hernia surgery  . FOOT SURGERY    . HIATAL HERNIA REPAIR    . knee surgeries    . neck surgeries    . UPPER GASTROINTESTINAL ENDOSCOPY  1980's   Social History   Social History Narrative  . No narrative on file     Objective: Vital Signs: BP 138/82 (BP Location:  Left Arm, Patient Position: Sitting, Cuff Size: Normal)   Pulse 72   Ht 6\' 3"  (1.905 m)   Wt (!) 304 lb (137.9 kg)   BMI 38.00 kg/m    Physical Exam  Constitutional: He is oriented to person, place, and time. He appears well-developed and well-nourished.  HENT:  Head: Normocephalic and atraumatic.  Eyes: Pupils are equal, round, and reactive to light. Conjunctivae and EOM are normal.  Neck: Normal range of motion. Neck supple.  Cardiovascular: Normal rate, regular rhythm and normal heart sounds.  Exam reveals no gallop  and no friction rub.   No murmur heard. Pulmonary/Chest: Effort normal and breath sounds normal. No respiratory distress. He has no wheezes. He has no rales. He exhibits no tenderness.  Abdominal: Soft. He exhibits no distension and no mass. There is no tenderness. There is no guarding.  Musculoskeletal: Normal range of motion.  Lymphadenopathy:    He has no cervical adenopathy.  Neurological: He is alert and oriented to person, place, and time. He exhibits normal muscle tone. Coordination normal.  Skin: Skin is warm and dry. Capillary refill takes less than 2 seconds. No rash noted.  Psychiatric: He has a normal mood and affect. His behavior is normal. Judgment and thought content normal.  Vitals reviewed.    Musculoskeletal Exam:  Full range of motion of all joints Grip strength is equal and strong bilaterally Fiber myalgia tender points are absent  CDAI Exam: CDAI Homunculus Exam:   Joint Counts:  CDAI Tender Joint count: 0 CDAI Swollen Joint count: 0  Global Assessments:  Patient Global Assessment: 6 Provider Global Assessment: 6  CDAI Calculated Score: 12   Investigation: No additional findings. Orders Only on 01/03/2017  Component Date Value Ref Range Status  . WBC 01/03/2017 7.0  3.4 - 10.8 x10E3/uL Final  . RBC 01/03/2017 4.88  4.14 - 5.80 x10E6/uL Final  . Hemoglobin 01/03/2017 14.2  13.0 - 17.7 g/dL Final  . Hematocrit 04/54/098102/03/2017 44.0  37.5 - 51.0 % Final  . MCV 01/03/2017 90  79 - 97 fL Final  . MCH 01/03/2017 29.1  26.6 - 33.0 pg Final  . MCHC 01/03/2017 32.3  31.5 - 35.7 g/dL Final  . RDW 19/14/782902/03/2017 13.4  12.3 - 15.4 % Final  . Platelets 01/03/2017 209  150 - 379 x10E3/uL Final  . Neutrophils 01/03/2017 58  Not Estab. % Final  . Lymphs 01/03/2017 32  Not Estab. % Final  . Monocytes 01/03/2017 9  Not Estab. % Final  . Eos 01/03/2017 1  Not Estab. % Final  . Basos 01/03/2017 0  Not Estab. % Final  . Neutrophils Absolute 01/03/2017 4.1  1.4 - 7.0 x10E3/uL  Final  . Lymphocytes Absolute 01/03/2017 2.3  0.7 - 3.1 x10E3/uL Final  . Monocytes Absolute 01/03/2017 0.6  0.1 - 0.9 x10E3/uL Final  . EOS (ABSOLUTE) 01/03/2017 0.1  0.0 - 0.4 x10E3/uL Final  . Basophils Absolute 01/03/2017 0.0  0.0 - 0.2 x10E3/uL Final  . Immature Granulocytes 01/03/2017 0  Not Estab. % Final  . Immature Grans (Abs) 01/03/2017 0.0  0.0 - 0.1 x10E3/uL Final   Comment: A hand-written panel/profile was received from your office. In accordance with the [facility] Ambiguous Test Code Policy dated July 2003, we have assigned CBC with Differential/Platelet, Test Code #005009 to this request. If this is not the testing you wished to receive on this specimen, please contact the [facility] Client Inquiry/ Technical Services Department to clarify the test order. We appreciate your  business.   . Glucose 01/03/2017 138* 65 - 99 mg/dL Final  . BUN 16/08/9603 12  6 - 24 mg/dL Final  . Creatinine, Ser 01/03/2017 0.97  0.76 - 1.27 mg/dL Final  . GFR calc non Af Amer 01/03/2017 87  >59 mL/min/1.73 Final  . GFR calc Af Amer 01/03/2017 100  >59 mL/min/1.73 Final  . BUN/Creatinine Ratio 01/03/2017 12  9 - 20 Final  . Sodium 01/03/2017 146* 134 - 144 mmol/L Final  . Potassium 01/03/2017 4.3  3.5 - 5.2 mmol/L Final  . Chloride 01/03/2017 108* 96 - 106 mmol/L Final  . CO2 01/03/2017 22  18 - 29 mmol/L Final  . Calcium 01/03/2017 9.3  8.7 - 10.2 mg/dL Final  . Total Protein 01/03/2017 6.7  6.0 - 8.5 g/dL Final  . Albumin 54/07/8118 4.5  3.5 - 5.5 g/dL Final  . Globulin, Total 01/03/2017 2.2  1.5 - 4.5 g/dL Final  . Albumin/Globulin Ratio 01/03/2017 2.0  1.2 - 2.2 Final  . Bilirubin Total 01/03/2017 0.4  0.0 - 1.2 mg/dL Final  . Alkaline Phosphatase 01/03/2017 71  39 - 117 IU/L Final  . AST 01/03/2017 24  0 - 40 IU/L Final  . ALT 01/03/2017 40  0 - 44 IU/L Final  . Gloris [place] CMP14 Default 01/03/2017 Comment   Final   Comment: A hand-written panel/profile was received from your office.  In accordance with the [facility] Ambiguous Test Code Policy dated July 2003, we have completed your order by using the closest currently or formerly recognized AMA panel.  We have assigned Comprehensive Metabolic Panel (14), Test Code #322000 to this request.  If this is not the testing you wished to receive on this specimen, please contact the [facility] Client Inquiry/Technical Services Department to clarify the test order.  We appreciate your business.      Imaging: No results found.  Speciality Comments: No specialty comments available.    Procedures:  No procedures performed Allergies: Daypro [oxaprozin] and Morphine and related   Assessment / Plan:     Visit Diagnoses: Primary osteoarthritis of both hands  Primary osteoarthritis of both knees  Spondylosis of lumbar region without myelopathy or radiculopathy   Plan: #1: OA of bilateral hands. Ongoing discomfort at times. Patient uses Voltaren gel with good results.  #2: OA of bilateral knees. Patient has been offered physical supplementation at the last visit as well as this visit but declines. He is fearful of needles. He had a bad experience with knee injection in the past. I reassured him that our Visco injections should be tolerable and patient will keep that in mind and let us know if he is interested.  #3: Bilateral hip pain. Ongoing.  #4: Low back pain. DDD of the L-spine Sees Dr. Channing Mutters  #5: Neck pain. DDD of the C-spine  #6: Return to clinic in 6 months  #7: Refill Voltaren gel 5 tubes with 5 refills We discussed the proper way of using Voltaren gel.  #8: Advised patient that he may benefit from inserts from the shoe market. (Ongoing feet pain from osteoarthritis).  #9: Obesity. BMI is 38. We discussed importance of weight loss. Patient will try.  #10: I requested patient to update his blood work today. Last labs are from February 2018. Patient declined.   Orders: No orders of the defined types  were placed in this encounter.  Meds ordered this encounter  Medications  . VOLTAREN 1 % GEL    Sig: APPLY 3GMS TO THREE LARGE  JOINTS UP TO THREE TIMES DAILY.    Dispense:  500 g    Refill:  5    Order Specific Question:   Supervising Provider    Answer:   Pollyann Savoy [phone]    Face-to-face time spent with patient was 30 minutes. Greater than 50% of time was spent in counseling and coordination of care.  Follow-Up Instructions: Return in about 6 months (around 12/24/2017).   Tawni Pummel, PA-C  Note - This record has been created using [employer].  Chart creation errors have been sought, but may not always  have been located. Such creation errors do not reflect on  the standard of medical care.

## 2017-06-23 ENCOUNTER — Ambulatory Visit (INDEPENDENT_AMBULATORY_CARE_PROVIDER_SITE_OTHER): Payer: Worker's Compensation | Admitting: Rheumatology

## 2017-06-23 ENCOUNTER — Encounter: Payer: Self-pay | Admitting: Rheumatology

## 2017-06-23 VITALS — BP 138/82 | HR 72 | Ht 75.0 in | Wt 304.0 lb

## 2017-06-23 DIAGNOSIS — M170 Bilateral primary osteoarthritis of knee: Secondary | ICD-10-CM

## 2017-06-23 DIAGNOSIS — M47816 Spondylosis without myelopathy or radiculopathy, lumbar region: Secondary | ICD-10-CM | POA: Diagnosis not present

## 2017-06-23 DIAGNOSIS — M19042 Primary osteoarthritis, left hand: Secondary | ICD-10-CM

## 2017-06-23 DIAGNOSIS — M5030 Other cervical disc degeneration, unspecified cervical region: Secondary | ICD-10-CM

## 2017-06-23 DIAGNOSIS — E6609 Other obesity due to excess calories: Secondary | ICD-10-CM | POA: Diagnosis not present

## 2017-06-23 DIAGNOSIS — M19041 Primary osteoarthritis, right hand: Secondary | ICD-10-CM

## 2017-06-23 DIAGNOSIS — Z6839 Body mass index (BMI) 39.0-39.9, adult: Secondary | ICD-10-CM

## 2017-06-23 DIAGNOSIS — M47812 Spondylosis without myelopathy or radiculopathy, cervical region: Secondary | ICD-10-CM

## 2017-06-23 MED ORDER — VOLTAREN 1 % TD GEL: TRANSDERMAL | 5 refills | Status: DC

## 2017-06-27 ENCOUNTER — Ambulatory Visit: Payer: Self-pay | Admitting: Rheumatology

## 2017-06-29 ENCOUNTER — Encounter (INDEPENDENT_AMBULATORY_CARE_PROVIDER_SITE_OTHER): Payer: Self-pay | Admitting: Internal Medicine

## 2017-06-29 ENCOUNTER — Ambulatory Visit (INDEPENDENT_AMBULATORY_CARE_PROVIDER_SITE_OTHER): Payer: BLUE CROSS/BLUE SHIELD | Admitting: Internal Medicine

## 2017-06-29 VITALS — BP 160/100 | HR 64 | Temp 97.5°F | Ht 75.0 in | Wt 297.0 lb

## 2017-06-29 DIAGNOSIS — K5900 Constipation, unspecified: Secondary | ICD-10-CM | POA: Diagnosis not present

## 2017-06-29 NOTE — Progress Notes (Signed)
   Subjective:    Patient ID: [name] [name], male    DOB: 08/08/1961, 56 y.o.   MRN: [id number]  HPI Here today for f/u. Last seen in August of 2017. Hx of constipation. Weight in August of 2017 was 317.  Today his weight is 297. Weight loss intentional. Diagnosed with DM2 about 6 months ago and he has changed his diet. He says his BS are running good. Appetite is good.  He is working full time. He does a lot of walking at work. He is having a BM every 3 days. He takes Linzess as needed.  Review of Systems Past Medical History:  Diagnosis Date  . Arthritis   . Chronic constipation   . Constipation   . Hypertension   . Hyperthyroidism     Past Surgical History:  Procedure Laterality Date  . ABDOMINAL HERNIA REPAIR     Patient states that it was a double hernia surgery  . FOOT SURGERY    . HIATAL HERNIA REPAIR    . knee surgeries    . neck surgeries    . UPPER GASTROINTESTINAL ENDOSCOPY  1980's    Allergies  Allergen Reactions  . Daypro [Oxaprozin] Other (See Comments)    Patient states that this medication caused bad abdominal cramping  . Morphine And Related     Current Outpatient Prescriptions on File Prior to Visit  Medication Sig Dispense Refill  . aspirin 81 MG tablet Take 81 mg by mouth daily.    Marland Kitchen. atorvastatin (LIPITOR) 20 MG tablet Take 20 mg by mouth daily.    . celecoxib (CELEBREX) 200 MG capsule Take 200 mg by mouth 2 (two) times daily.     . Cholecalciferol (VITAMIN D3) 5000 UNITS CAPS Take by mouth. One a day    . cyclobenzaprine (FLEXERIL) 10 MG tablet Take 10 mg by mouth as needed.    . gabapentin (NEURONTIN) 100 MG capsule TAKE 1 CAPSULE BY MOUTH IN THE MORNING AND AT NOON, THEN TAKE 3 CAPSULES AT BEDTIME AS DIRECTED. (Patient taking differently: TAKE 1 CAPSULE BY MOUTH IN THE MORNING AND AT NOON) 150 capsule 0  . levothyroxine (SYNTHROID, LEVOTHROID) 112 MCG tablet Take 112 mcg by mouth daily before breakfast.    . LINZESS 145 MCG CAPS capsule TAKE ONE  CAPSULE BY MOUTH TWICE A DAY (Patient taking differently: as needed) 60 capsule 2  . metFORMIN (GLUCOPHAGE) 500 MG tablet Take 500 mg by mouth 2 (two) times daily with a meal.    . verapamil (VERELAN PM) 360 MG 24 hr capsule Take 300 mg by mouth at bedtime.    . VOLTAREN 1 % GEL APPLY 3GMS TO THREE LARGE JOINTS UP TO THREE TIMES DAILY. 500 g 5   No current facility-administered medications on file prior to visit.         Objective:   Physical Exam Blood pressure (!) 160/100, pulse 64, temperature (!) 97.5 F (36.4 C), height 6\' 3"  (1.905 m), weight 297 lb (134.7 kg). Alert and oriented. Skin warm and dry. Oral mucosa is moist.   . Sclera anicteric, conjunctivae is pink. Thyroid not enlarged. No cervical lymphadenopathy. Lungs clear. Heart regular rate and rhythm.  Abdomen is soft. Bowel sounds are positive. No hepatomegaly. No abdominal masses felt. No tenderness.  No edema to lower extremities.          Assessment & Plan:  Constipation. He is doing well. Takes Linzess as needed. OV in year. Samples of LInzess given to patient

## 2017-06-29 NOTE — Patient Instructions (Addendum)
Continue to LInzess at needed OV in 1 year. Samples of Linzess given to patien

## 2017-07-22 ENCOUNTER — Other Ambulatory Visit: Payer: Self-pay | Admitting: Rheumatology

## 2017-07-25 NOTE — Telephone Encounter (Signed)
Last Visit:06/23/17 Next Visit: 12/28/17  Okay to refill per Dr. Deveshwar 

## 2017-08-01 ENCOUNTER — Other Ambulatory Visit: Payer: Self-pay | Admitting: Rheumatology

## 2017-08-02 NOTE — Telephone Encounter (Signed)
Last Visit:06/23/17 Next Visit: 12/28/17  Okay to refill per Dr. Corliss Skainseveshwar

## 2017-09-05 ENCOUNTER — Telehealth: Payer: Self-pay | Admitting: Rheumatology

## 2017-09-05 DIAGNOSIS — Z79899 Other long term (current) drug therapy: Secondary | ICD-10-CM

## 2017-09-05 NOTE — Telephone Encounter (Signed)
Patient having labs done at [facility] in [place] on Friday thru [facility], and would like to get labs done thru [place] there as well. Please send orders.

## 2017-09-06 NOTE — Telephone Encounter (Signed)
Labs Released and faxed

## 2017-09-11 LAB — CMP14+EGFR
A/G RATIO: 2.2 (ref 1.2–2.2)
ALBUMIN: 4.8 g/dL (ref 3.5–5.5)
ALK PHOS: 66 IU/L (ref 39–117)
ALT: 32 IU/L (ref 0–44)
AST: 23 IU/L (ref 0–40)
BILIRUBIN TOTAL: 0.6 mg/dL (ref 0.0–1.2)
BUN / CREAT RATIO: 14 (ref 9–20)
BUN: 13 mg/dL (ref 6–24)
CHLORIDE: 105 mmol/L (ref 96–106)
CO2: 22 mmol/L (ref 20–29)
Calcium: 9.9 mg/dL (ref 8.7–10.2)
Creatinine, Ser: 0.9 mg/dL (ref 0.76–1.27)
GFR calc non Af Amer: 94 mL/min/{1.73_m2} (ref >59)
GFR, EST AFRICAN AMERICAN: 109 mL/min/{1.73_m2} (ref >59)
GLOBULIN, TOTAL: 2.2 g/dL (ref 1.5–4.5)
GLUCOSE: 139 mg/dL — AB (ref 65–99)
POTASSIUM: 4.2 mmol/L (ref 3.5–5.2)
SODIUM: 143 mmol/L (ref 134–144)
Total Protein: 7 g/dL (ref 6.0–8.5)

## 2017-09-11 LAB — CBC WITH DIFFERENTIAL/PLATELET
BASOS ABS: 0 10*3/uL (ref 0.0–0.2)
Basos: 0 %
EOS (ABSOLUTE): 0.1 10*3/uL (ref 0.0–0.4)
EOS: 1 %
Hematocrit: 45.2 % (ref 37.5–51.0)
Hemoglobin: 14.8 g/dL (ref 13.0–17.7)
IMMATURE GRANULOCYTES: 0 %
Immature Grans (Abs): 0 10*3/uL (ref 0.0–0.1)
Lymphocytes Absolute: 2.2 10*3/uL (ref 0.7–3.1)
Lymphs: 31 %
MCH: 29.7 pg (ref 26.6–33.0)
MCHC: 32.7 g/dL (ref 31.5–35.7)
MCV: 91 fL (ref 79–97)
MONOCYTES: 8 %
MONOS ABS: 0.6 10*3/uL (ref 0.1–0.9)
NEUTROS PCT: 60 %
Neutrophils Absolute: 4.4 10*3/uL (ref 1.4–7.0)
PLATELETS: 219 10*3/uL (ref 150–379)
RBC: 4.98 x10E6/uL (ref 4.14–5.80)
RDW: 13.4 % (ref 12.3–15.4)
WBC: 7.3 10*3/uL (ref 3.4–10.8)

## 2017-09-12 NOTE — Telephone Encounter (Signed)
stable °

## 2017-10-10 ENCOUNTER — Other Ambulatory Visit: Payer: Self-pay | Admitting: Rheumatology

## 2017-10-10 NOTE — Telephone Encounter (Signed)
Last Visit:06/23/17 Next Visit: 12/28/17  Okay to refill per Dr. Deveshwar 

## 2017-12-14 NOTE — Progress Notes (Deleted)
Office Visit Note  Patient: [name] [name]             Date of Birth: 1961/07/19           MRN: [id number]             PCP: Carylon Perches, MD Referring: Carylon Perches, MD Visit Date: 12/28/2017 Occupation: @GUAROCC @    Subjective:  No chief complaint on file.   History of Present Illness: [name] [name] is a 57 y.o. male ***   Activities of Daily Living:  Patient reports morning stiffness for *** {minute/hour:19697}.   Patient {ACTIONS;DENIES/REPORTS:21021675::"Denies"} nocturnal pain.  Difficulty dressing/grooming: {ACTIONS;DENIES/REPORTS:21021675::"Denies"} Difficulty climbing stairs: {ACTIONS;DENIES/REPORTS:21021675::"Denies"} Difficulty getting out of chair: {ACTIONS;DENIES/REPORTS:21021675::"Denies"} Difficulty using hands for taps, buttons, cutlery, and/or writing: {ACTIONS;DENIES/REPORTS:21021675::"Denies"}   No Rheumatology ROS completed.   PMFS History:  Patient Active Problem List   Diagnosis Date Noted  . History of environmental allergies 12/28/2016  . Type 2 diabetes mellitus without complication, without long-term current use of insulin (HCC) 12/28/2016  . Primary osteoarthritis of both hands 12/25/2016  . Gastroesophageal reflux disease without esophagitis 12/25/2016  . Neuralgia 12/25/2016  . Primary osteoarthritis of both knees 12/24/2016  . Spondylosis of lumbar region without myelopathy or radiculopathy 12/24/2016  . DJD (degenerative joint disease), cervical 12/24/2016  . Obesity 10/08/2013  . Arthritis of both knees 10/08/2013  . LBP (low back pain) 10/08/2013  . HTN (hypertension) 10/08/2013  . Hypothyroidism 10/08/2013  . Constipation 10/04/2012    Past Medical History:  Diagnosis Date  . Arthritis   . Chronic constipation   . Constipation   . Hypertension   . Hyperthyroidism     Family History  Problem Relation Age of Onset  . Lung disease Sister    Past Surgical History:  Procedure Laterality Date  . ABDOMINAL HERNIA REPAIR     Patient states that it was a double hernia surgery  . FOOT SURGERY    . HIATAL HERNIA REPAIR    . knee surgeries    . neck surgeries    . UPPER GASTROINTESTINAL ENDOSCOPY  1980's   Social History   Social History Narrative  . Not on file     Objective: Vital Signs: There were no vitals taken for this visit.   Physical Exam   Musculoskeletal Exam: ***  CDAI Exam: No CDAI exam completed.    Investigation: No additional findings. CBC Latest Ref Rng & Units 09/09/2017 01/03/2017 07/04/2009  WBC 3.4 - 10.8 x10E3/uL 7.3 7.0 11.2(H)  Hemoglobin 13.0 - 17.7 g/dL 14.7 82.9 56.2  Hematocrit 37.5 - 51.0 % 45.2 44.0 38.7(L)  Platelets 150 - 379 x10E3/uL 219 209 180   CMP Latest Ref Rng & Units 09/09/2017 01/03/2017 07/03/2009  Glucose 65 - 99 mg/dL 130(Q) 657(Q) -  BUN 6 - 24 mg/dL 13 12 -  Creatinine 4.69 - 1.27 mg/dL 6.29 5.28 4.13  Sodium 134 - 144 mmol/L 143 146(H) -  Potassium 3.5 - 5.2 mmol/L 4.2 4.3 4.2  Chloride 96 - 106 mmol/L 105 108(H) -  CO2 20 - 29 mmol/L 22 22 -  Calcium 8.7 - 10.2 mg/dL 9.9 9.3 -  Total Protein 6.0 - 8.5 g/dL 7.0 6.7 -  Total Bilirubin 0.0 - 1.2 mg/dL 0.6 0.4 -  Alkaline Phos 39 - 117 IU/L 66 71 -  AST 0 - 40 IU/L 23 24 -  ALT 0 - 44 IU/L 32 40 -    Imaging: No results found.  Speciality Comments: No specialty comments available.  Procedures:  No procedures performed Allergies: Daypro [oxaprozin] and Morphine and related   Assessment / Plan:     Visit Diagnoses: No diagnosis found.    Orders: No orders of the defined types were placed in this encounter.  No orders of the defined types were placed in this encounter.   Face-to-face time spent with patient was *** minutes. 50% of time was spent in counseling and coordination of care.  Follow-Up Instructions: No Follow-up on file.   Ellen HenriMarissa C Susann Lawhorne, CMA  Note - This record has been created using Animal nutritionistDragon software.  Chart creation errors have been sought, but may not always  have  been located. Such creation errors do not reflect on  the standard of medical care.

## 2017-12-27 ENCOUNTER — Other Ambulatory Visit: Payer: Self-pay | Admitting: Rheumatology

## 2017-12-27 NOTE — Telephone Encounter (Signed)
Last Visit:06/23/17 Next Visit: 12/28/17  Okay to refill per Dr. Deveshwar 

## 2017-12-28 ENCOUNTER — Ambulatory Visit: Payer: Self-pay | Admitting: Rheumatology

## 2018-01-20 NOTE — Progress Notes (Deleted)
Office Visit Note  Patient: [name] [name]             Date of Birth: 11/01/1961           MRN: [id number]             PCP: Carylon Perches, MD Referring: Carylon Perches, MD Visit Date: 02/03/2018 Occupation: @GUAROCC @    Subjective:  No chief complaint on file.   History of Present Illness: [name] [name] is a 57 y.o. male ***   Activities of Daily Living:  Patient reports morning stiffness for *** {minute/hour:19697}.   Patient {ACTIONS;DENIES/REPORTS:21021675::"Denies"} nocturnal pain.  Difficulty dressing/grooming: {ACTIONS;DENIES/REPORTS:21021675::"Denies"} Difficulty climbing stairs: {ACTIONS;DENIES/REPORTS:21021675::"Denies"} Difficulty getting out of chair: {ACTIONS;DENIES/REPORTS:21021675::"Denies"} Difficulty using hands for taps, buttons, cutlery, and/or writing: {ACTIONS;DENIES/REPORTS:21021675::"Denies"}   No Rheumatology ROS completed.   PMFS History:  Patient Active Problem List   Diagnosis Date Noted  . History of environmental allergies 12/28/2016  . Type 2 diabetes mellitus without complication, without long-term current use of insulin (HCC) 12/28/2016  . Primary osteoarthritis of both hands 12/25/2016  . Gastroesophageal reflux disease without esophagitis 12/25/2016  . Neuralgia 12/25/2016  . Primary osteoarthritis of both knees 12/24/2016  . Spondylosis of lumbar region without myelopathy or radiculopathy 12/24/2016  . DJD (degenerative joint disease), cervical 12/24/2016  . Obesity 10/08/2013  . Arthritis of both knees 10/08/2013  . LBP (low back pain) 10/08/2013  . HTN (hypertension) 10/08/2013  . Hypothyroidism 10/08/2013  . Constipation 10/04/2012    Past Medical History:  Diagnosis Date  . Arthritis   . Chronic constipation   . Constipation   . Hypertension   . Hyperthyroidism     Family History  Problem Relation Age of Onset  . Lung disease Sister    Past Surgical History:  Procedure Laterality Date  . ABDOMINAL HERNIA REPAIR     Patient states that it was a double hernia surgery  . FOOT SURGERY    . HIATAL HERNIA REPAIR    . knee surgeries    . neck surgeries    . UPPER GASTROINTESTINAL ENDOSCOPY  1980's   Social History   Social History Narrative  . Not on file     Objective: Vital Signs: There were no vitals taken for this visit.   Physical Exam   Musculoskeletal Exam: ***  CDAI Exam: No CDAI exam completed.    Investigation: No additional findings. CBC Latest Ref Rng & Units 09/09/2017 01/03/2017 07/04/2009  WBC 3.4 - 10.8 x10E3/uL 7.3 7.0 11.2(H)  Hemoglobin 13.0 - 17.7 g/dL 13.2 44.0 10.2  Hematocrit 37.5 - 51.0 % 45.2 44.0 38.7(L)  Platelets 150 - 379 x10E3/uL 219 209 180   CMP Latest Ref Rng & Units 09/09/2017 01/03/2017 07/03/2009  Glucose 65 - 99 mg/dL 725(D) 664(Q) -  BUN 6 - 24 mg/dL 13 12 -  Creatinine 0.34 - 1.27 mg/dL 7.42 5.95 6.38  Sodium 134 - 144 mmol/L 143 146(H) -  Potassium 3.5 - 5.2 mmol/L 4.2 4.3 4.2  Chloride 96 - 106 mmol/L 105 108(H) -  CO2 20 - 29 mmol/L 22 22 -  Calcium 8.7 - 10.2 mg/dL 9.9 9.3 -  Total Protein 6.0 - 8.5 g/dL 7.0 6.7 -  Total Bilirubin 0.0 - 1.2 mg/dL 0.6 0.4 -  Alkaline Phos 39 - 117 IU/L 66 71 -  AST 0 - 40 IU/L 23 24 -  ALT 0 - 44 IU/L 32 40 -    Imaging: No results found.  Speciality Comments: No specialty comments available.  Procedures:  No procedures performed Allergies: Daypro [oxaprozin] and Morphine and related   Assessment / Plan:     Visit Diagnoses: No diagnosis found.    Orders: No orders of the defined types were placed in this encounter.  No orders of the defined types were placed in this encounter.   Face-to-face time spent with patient was *** minutes. 50% of time was spent in counseling and coordination of care.  Follow-Up Instructions: No Follow-up on file.   Gearldine Bienenstockaylor M Ileanna Gemmill, PA-C  Note - This record has been created using Dragon software.  Chart creation errors have been sought, but may not always  have  been located. Such creation errors do not reflect on  the standard of medical care.

## 2018-02-03 ENCOUNTER — Ambulatory Visit: Payer: Self-pay | Admitting: Physician Assistant

## 2018-02-03 NOTE — Progress Notes (Signed)
Office Visit Note  Patient: [name] [name]             Date of Birth: 1961-03-23           MRN: [id number]             PCP: Carylon Perches, MD Referring: Carylon Perches, MD Visit Date: 02/15/2018 Occupation: @GUAROCC @    Subjective:  Pain in multiple joints    History of Present Illness: [name] [name] is a 57 y.o. male with history of osteoarthritis and degenerative disc disease.  Patient states that about a week ago he had significant lower back pain and sciatica down the left side of his leg.  He is also having significant muscle spasms in his lower back.  He states that he saw Dr. Madelon Lips a few days later and was prescribed prednisone.  Patient states he was taking Flexeril 10 mg 3 times daily.  He continues to take gabapentin daily. he states with these combinations of medications he started to feel better.  He still has some discomfort in his lower back.  Patient states that his neck has been doing okay.  He has some cracking with range of motion.  Patient states he is also been having discomfort in his bilateral hands and bilateral knees.  He states that he needs knee replacements and bilateral knees but is trying to put it off as long as he can.  He denies any joint swelling in his knees.  He states he wears a brace on his right knee.  He also uses an Ace bandage and Voltaren gel on a daily basis for bilateral knees.  He takes Celebrex 200 mg twice daily.  He needs a refill for Celebrex today.  Patient states he ices his bilateral knees if he had is having severe pain.   Activities of Daily Living:  Patient reports morning stiffness for 1 hour.   Patient Denies nocturnal pain.  Difficulty dressing/grooming: Denies Difficulty climbing stairs: Reports Difficulty getting out of chair: Reports Difficulty using hands for taps, buttons, cutlery, and/or writing: Reports   Review of Systems  Constitutional: Negative for fatigue, night sweats and weakness.  HENT: Positive for mouth dryness.  Negative for mouth sores, trouble swallowing, trouble swallowing and nose dryness.   Eyes: Positive for dryness Benay Spice). Negative for pain, redness and visual disturbance.  Respiratory: Negative for cough, hemoptysis, shortness of breath and difficulty breathing.   Cardiovascular: Negative for chest pain, palpitations, hypertension, irregular heartbeat and swelling in legs/feet.  Gastrointestinal: Negative for blood in stool, constipation and diarrhea.  Endocrine: Negative for increased urination.  Genitourinary: Negative for painful urination.  Musculoskeletal: Positive for arthralgias, joint pain, joint swelling, myalgias, morning stiffness and myalgias. Negative for muscle weakness and muscle tenderness.  Skin: Negative for color change, rash, hair loss, nodules/bumps, skin tightness, ulcers and sensitivity to sunlight.  Allergic/Immunologic: Negative for susceptible to infections.  Neurological: Negative for dizziness, fainting, memory loss and night sweats.  Hematological: Negative for swollen glands.  Psychiatric/Behavioral: Negative for depressed mood and sleep disturbance. The patient is not nervous/anxious.     PMFS History:  Patient Active Problem List   Diagnosis Date Noted  . History of environmental allergies 12/28/2016  . Type 2 diabetes mellitus without complication, without long-term current use of insulin (HCC) 12/28/2016  . Primary osteoarthritis of both hands 12/25/2016  . Gastroesophageal reflux disease without esophagitis 12/25/2016  . Neuralgia 12/25/2016  . Primary osteoarthritis of both knees 12/24/2016  . Spondylosis of lumbar region  without myelopathy or radiculopathy 12/24/2016  . DJD (degenerative joint disease), cervical 12/24/2016  . Obesity 10/08/2013  . Arthritis of both knees 10/08/2013  . LBP (low back pain) 10/08/2013  . HTN (hypertension) 10/08/2013  . Hypothyroidism 10/08/2013  . Constipation 10/04/2012    Past Medical History:  Diagnosis Date    . Arthritis   . Chronic constipation   . Constipation   . Hypertension   . Hyperthyroidism     Family History  Problem Relation Age of Onset  . Lung disease Sister    Past Surgical History:  Procedure Laterality Date  . ABDOMINAL HERNIA REPAIR     Patient states that it was a double hernia surgery  . FOOT SURGERY    . HIATAL HERNIA REPAIR    . knee surgeries    . neck surgeries    . UPPER GASTROINTESTINAL ENDOSCOPY  1980's   Social History   Social History Narrative  . Not on file     Objective: Vital Signs: BP 125/81 (BP Location: Left Arm, Patient Position: Sitting, Cuff Size: Large)   Pulse 70   Resp 12   Wt (!) 303 lb (137.4 kg)   BMI 37.87 kg/m    Physical Exam  Constitutional: He is oriented to person, place, and time. He appears well-developed and well-nourished.  HENT:  Head: Normocephalic and atraumatic.  Eyes: Conjunctivae and EOM are normal. Pupils are equal, round, and reactive to light.  Neck: Normal range of motion. Neck supple.  Cardiovascular: Normal rate, regular rhythm and normal heart sounds.  Pulmonary/Chest: Effort normal and breath sounds normal.  Abdominal: Soft. Bowel sounds are normal.  Neurological: He is alert and oriented to person, place, and time.  Skin: Skin is warm and dry. Capillary refill takes less than 2 seconds.  Psychiatric: He has a normal mood and affect. His behavior is normal.  Nursing note and vitals reviewed.    Musculoskeletal Exam: C-spine limited range of motion with lateral rotation with crepitus.  Limited range of motion of thoracic and lumbar spine.  He has mild thoracic kyphosis.  Shoulder joints, elbow joints, wrist joints, MCPs, PIPs, DIPs good range of motion.  Complete fist formation bilaterally.  He has some soft tissue tenderness between his thumb and index finger bilaterally.  Hip joints good range of motion.  Right knee warmth with no effusion  He is wearing a brace on his right knee and an ace bandage on  his left knee.  He has knee crepitus bilaterally.  He has valgus deformity of his right knee.  Ankle joints good range of motion.  CDAI Exam: No CDAI exam completed.    Investigation: No additional findings. CBC Latest Ref Rng & Units 09/09/2017 01/03/2017 07/04/2009  WBC 3.4 - 10.8 x10E3/uL 7.3 7.0 11.2(H)  Hemoglobin 13.0 - 17.7 g/dL 16.1 09.6 04.5  Hematocrit 37.5 - 51.0 % 45.2 44.0 38.7(L)  Platelets 150 - 379 x10E3/uL 219 209 180   CMP Latest Ref Rng & Units 09/09/2017 01/03/2017 07/03/2009  Glucose 65 - 99 mg/dL 409(W) 119(J) -  BUN 6 - 24 mg/dL 13 12 -  Creatinine 4.78 - 1.27 mg/dL 2.95 6.21 3.08  Sodium 134 - 144 mmol/L 143 146(H) -  Potassium 3.5 - 5.2 mmol/L 4.2 4.3 4.2  Chloride 96 - 106 mmol/L 105 108(H) -  CO2 20 - 29 mmol/L 22 22 -  Calcium 8.7 - 10.2 mg/dL 9.9 9.3 -  Total Protein 6.0 - 8.5 g/dL 7.0 6.7 -  Total Bilirubin  0.0 - 1.2 mg/dL 0.6 0.4 -  Alkaline Phos 39 - 117 IU/L 66 71 -  AST 0 - 40 IU/L 23 24 -  ALT 0 - 44 IU/L 32 40 -     Imaging: No results found.  Speciality Comments: No specialty comments available.    Procedures:  No procedures performed Allergies: Daypro [oxaprozin] and Morphine and related   Assessment / Plan:     Visit Diagnoses: Primary osteoarthritis of both hands - He has PIP and DIP synovial thickening consistent with osteoarthritis.  He has no synovitis on exam.  He has some soft tissue soreness between his thumb and index finger bilaterally.  He was given a handout of hand exercises that he can perform at home.  He was advised to use Voltaren gel on his hands.  He was given a refill of Celebrex 200 mg BID.    Primary osteoarthritis of both knees: He has warmth of his right knee.  He has bilateral knee crepitus and a valgus deformity of his right knee. He wears a brace on his right knee. He wears an ace bandage on his left knee.  He uses Voltaren gel on his bilateral knees on a daily basis.  DDD (degenerative disc disease), cervical: He  has crepitus with range of motion.  He has some limitation with lateral rotation.  He has no discomfort at this time.  DDD (degenerative disc disease), lumbar: Chronic pain.  He recently saw Dr. Madelon Lipsaffrey due to increased lower back pain and muscle spasms.  He was started on prednisone and was taking Flexeril 10 mg 3 times daily which resolved his muscle spasms and back pain.  He will follow-up with Dr. Madelon Lipsaffrey on a as needed basis.  Other medical conditions are listed as follows:  Neuralgia: He takes gabapentin on a daily basis.  History of gastroesophageal reflux (GERD)  Type 2 diabetes mellitus without complication, without long-term current use of insulin (HCC)  Essential hypertension  History of environmental allergies  History of hypothyroidism    Orders: No orders of the defined types were placed in this encounter.  Meds ordered this encounter  Medications  . celecoxib (CELEBREX) 200 MG capsule    Sig: Take 1 capsule (200 mg total) by mouth 2 (two) times daily.    Dispense:  60 capsule    Refill:  3      Follow-Up Instructions: Return in about 6 months (around 08/18/2018) for Osteoarthritis.   Gearldine Bienenstockaylor M Brendin Situ, PA-C   I examined and evaluated the patient with Sherron Alesaylor Persephonie Hegwood PA. The plan of care was discussed as noted above.  Pollyann SavoyShaili Deveshwar, MD  Note - This record has been created using Animal nutritionistDragon software.  Chart creation errors have been sought, but may not always  have been located. Such creation errors do not reflect on  the standard of medical care.

## 2018-02-04 ENCOUNTER — Other Ambulatory Visit: Payer: Self-pay | Admitting: Rheumatology

## 2018-02-06 NOTE — Telephone Encounter (Signed)
Last Visit:06/23/17 Next Visit: 02/15/18  Okay to refill per Dr. Corliss Skainseveshwar

## 2018-02-15 ENCOUNTER — Ambulatory Visit (INDEPENDENT_AMBULATORY_CARE_PROVIDER_SITE_OTHER): Payer: Worker's Compensation | Admitting: Physician Assistant

## 2018-02-15 ENCOUNTER — Encounter: Payer: Self-pay | Admitting: Physician Assistant

## 2018-02-15 VITALS — BP 125/81 | HR 70 | Resp 12 | Wt 303.0 lb

## 2018-02-15 DIAGNOSIS — Z8639 Personal history of other endocrine, nutritional and metabolic disease: Secondary | ICD-10-CM | POA: Diagnosis not present

## 2018-02-15 DIAGNOSIS — M5136 Other intervertebral disc degeneration, lumbar region: Secondary | ICD-10-CM

## 2018-02-15 DIAGNOSIS — Z8719 Personal history of other diseases of the digestive system: Secondary | ICD-10-CM | POA: Diagnosis not present

## 2018-02-15 DIAGNOSIS — M170 Bilateral primary osteoarthritis of knee: Secondary | ICD-10-CM

## 2018-02-15 DIAGNOSIS — I10 Essential (primary) hypertension: Secondary | ICD-10-CM | POA: Diagnosis not present

## 2018-02-15 DIAGNOSIS — M19042 Primary osteoarthritis, left hand: Secondary | ICD-10-CM | POA: Diagnosis not present

## 2018-02-15 DIAGNOSIS — M19041 Primary osteoarthritis, right hand: Secondary | ICD-10-CM

## 2018-02-15 DIAGNOSIS — Z9109 Other allergy status, other than to drugs and biological substances: Secondary | ICD-10-CM

## 2018-02-15 DIAGNOSIS — E119 Type 2 diabetes mellitus without complications: Secondary | ICD-10-CM | POA: Diagnosis not present

## 2018-02-15 DIAGNOSIS — M792 Neuralgia and neuritis, unspecified: Secondary | ICD-10-CM | POA: Diagnosis not present

## 2018-02-15 DIAGNOSIS — M5030 Other cervical disc degeneration, unspecified cervical region: Secondary | ICD-10-CM

## 2018-02-15 MED ORDER — CELECOXIB 200 MG PO CAPS: 200.0000 mg | ORAL_CAPSULE | Freq: Two times a day (BID) | ORAL | 3 refills | Status: DC

## 2018-02-15 NOTE — Patient Instructions (Signed)

## 2018-03-20 ENCOUNTER — Other Ambulatory Visit: Payer: Self-pay | Admitting: Rheumatology

## 2018-03-20 NOTE — Telephone Encounter (Signed)
Last Visit: 02/15/18 Next Visit: 08/18/18  Okay to refill per Dr. Corliss Skainseveshwar

## 2018-05-02 ENCOUNTER — Other Ambulatory Visit: Payer: Self-pay | Admitting: Rheumatology

## 2018-05-02 NOTE — Telephone Encounter (Signed)
Last Visit: 02/15/18 Next Visit: 08/18/18  Okay to refill per Dr. Corliss Skainseveshwar

## 2018-06-02 ENCOUNTER — Other Ambulatory Visit: Payer: Self-pay | Admitting: Physician Assistant

## 2018-06-02 ENCOUNTER — Other Ambulatory Visit: Payer: Self-pay | Admitting: Rheumatology

## 2018-06-02 DIAGNOSIS — Z79899 Other long term (current) drug therapy: Secondary | ICD-10-CM

## 2018-06-02 NOTE — Telephone Encounter (Signed)
Last Visit: 02/15/18 Next Visit: 08/18/18  Okay to refill per Dr. Deveshwar  

## 2018-06-02 NOTE — Telephone Encounter (Signed)
We will need to check renal function at next visit.

## 2018-06-02 NOTE — Telephone Encounter (Signed)
Last Visit: 02/15/18 Next Visit: 08/18/18  Okay to refill per Dr. Corliss Skainseveshwar

## 2018-06-16 LAB — CMP14+EGFR
ALBUMIN: 4.6 g/dL (ref 3.5–5.5)
ALK PHOS: 69 IU/L (ref 39–117)
ALT: 27 IU/L (ref 0–44)
AST: 18 IU/L (ref 0–40)
Albumin/Globulin Ratio: 2.3 — ABNORMAL HIGH (ref 1.2–2.2)
BILIRUBIN TOTAL: 0.5 mg/dL (ref 0.0–1.2)
BUN / CREAT RATIO: 12 (ref 9–20)
BUN: 10 mg/dL (ref 6–24)
CO2: 21 mmol/L (ref 20–29)
Calcium: 9.3 mg/dL (ref 8.7–10.2)
Chloride: 108 mmol/L — ABNORMAL HIGH (ref 96–106)
Creatinine, Ser: 0.85 mg/dL (ref 0.76–1.27)
GFR calc Af Amer: 112 mL/min/{1.73_m2} (ref >59)
GFR calc non Af Amer: 97 mL/min/{1.73_m2} (ref >59)
GLOBULIN, TOTAL: 2 g/dL (ref 1.5–4.5)
Glucose: 145 mg/dL — ABNORMAL HIGH (ref 65–99)
Potassium: 4.4 mmol/L (ref 3.5–5.2)
SODIUM: 146 mmol/L — AB (ref 134–144)
Total Protein: 6.6 g/dL (ref 6.0–8.5)

## 2018-06-29 ENCOUNTER — Ambulatory Visit (INDEPENDENT_AMBULATORY_CARE_PROVIDER_SITE_OTHER): Payer: BLUE CROSS/BLUE SHIELD | Admitting: Internal Medicine

## 2018-06-29 ENCOUNTER — Encounter (INDEPENDENT_AMBULATORY_CARE_PROVIDER_SITE_OTHER): Payer: Self-pay | Admitting: Internal Medicine

## 2018-07-13 ENCOUNTER — Telehealth: Payer: Self-pay | Admitting: Rheumatology

## 2018-07-13 MED ORDER — VOLTAREN 1 % TD GEL: TRANSDERMAL | 3 refills | Status: DC

## 2018-07-13 NOTE — Telephone Encounter (Signed)
Last visit: 02/15/2018 Next visit: 08/18/2018  Okay to refill per Dr.Deveshwar.

## 2018-07-13 NOTE — Telephone Encounter (Signed)
Patient called requesting prescription refill of Voltaren Gel to [facility] [place].

## 2018-08-04 NOTE — Progress Notes (Signed)
Office Visit Note  Patient: [name] [name]             Date of Birth: Jun 06, 1961           MRN: [id number]             PCP: Carylon Perches, MD Referring: Carylon Perches, MD Visit Date: 08/18/2018 Occupation: @GUAROCC @  Subjective:  Right ankle pain   History of Present Illness: [name] [name] is a 57 y.o. male with history of osteoarthritis and DDD. He presents today with right ankle pain for the past 2 months.  He denies any injury or twisting.  He states the pain is most severe when bearing weight.  He has been having increased stiffness in the right ankle joint as well as joint swelling on the medial aspect.  He has been taking Celebrex for pain relief. He continues to take Gabapentin as described.  He also takes Flexeril 10 mg PRN for muscle spasms.  He has chronic bilateral knee pain, and he wears knee braces daily.  He had intermittent left wrist pain, but he denies any joint swelling.  He states the pain is most severe with overuse activities. He continues to have chronic lower back and neck pain.   Activities of Daily Living:  Patient reports morning stiffness all day.   Patient Denies nocturnal pain.  Difficulty dressing/grooming: Denies Difficulty climbing stairs: Reports Difficulty getting out of chair: Reports Difficulty using hands for taps, buttons, cutlery, and/or writing: Reports  Review of Systems  Constitutional: Positive for fatigue. Negative for night sweats.  HENT: Negative for mouth sores, trouble swallowing, trouble swallowing, mouth dryness and nose dryness.   Eyes: Negative for pain, redness, visual disturbance and dryness.  Respiratory: Negative for cough, shortness of breath, wheezing and difficulty breathing.   Cardiovascular: Negative for chest pain, palpitations, hypertension, irregular heartbeat and swelling in legs/feet.  Gastrointestinal: Negative for abdominal pain, constipation, diarrhea, nausea and vomiting.  Endocrine: Negative for increased urination.    Genitourinary: Negative for painful urination, pelvic pain and urgency.  Musculoskeletal: Positive for arthralgias, gait problem, joint pain, joint swelling and morning stiffness. Negative for myalgias, muscle weakness, muscle tenderness and myalgias.  Skin: Negative for color change, rash, hair loss, nodules/bumps, skin tightness, ulcers and sensitivity to sunlight.  Allergic/Immunologic: Negative for susceptible to infections.  Neurological: Negative for dizziness, fainting, light-headedness, headaches, memory loss, night sweats and weakness.  Hematological: Negative for swollen glands.  Psychiatric/Behavioral: Negative for depressed mood, confusion and sleep disturbance. The patient is not nervous/anxious.     PMFS History:  Patient Active Problem List   Diagnosis Date Noted  . History of environmental allergies 12/28/2016  . Type 2 diabetes mellitus without complication, without long-term current use of insulin (HCC) 12/28/2016  . Primary osteoarthritis of both hands 12/25/2016  . Gastroesophageal reflux disease without esophagitis 12/25/2016  . Neuralgia 12/25/2016  . Primary osteoarthritis of both knees 12/24/2016  . Spondylosis of lumbar region without myelopathy or radiculopathy 12/24/2016  . DJD (degenerative joint disease), cervical 12/24/2016  . Obesity 10/08/2013  . Arthritis of both knees 10/08/2013  . LBP (low back pain) 10/08/2013  . HTN (hypertension) 10/08/2013  . Hypothyroidism 10/08/2013  . Constipation 10/04/2012    Past Medical History:  Diagnosis Date  . Arthritis   . Chronic constipation   . Constipation   . Hypertension   . Hyperthyroidism     Family History  Problem Relation Age of Onset  . Lung disease Sister    Past  Surgical History:  Procedure Laterality Date  . ABDOMINAL HERNIA REPAIR     Patient states that it was a double hernia surgery  . FOOT SURGERY    . HIATAL HERNIA REPAIR    . knee surgeries    . neck surgeries    . UPPER  GASTROINTESTINAL ENDOSCOPY  1980's   Social History   Social History Narrative  . Not on file    Objective: Vital Signs: BP 131/82 (BP Location: Left Arm, Patient Position: Sitting, Cuff Size: Normal)   Pulse 65   Resp 16   Ht 6\' 3"  (1.905 m)   Wt (!) 302 lb 9.6 oz (137.3 kg)   BMI 37.82 kg/m    Physical Exam  Constitutional: He is oriented to person, place, and time. He appears well-developed and well-nourished.  HENT:  Head: Normocephalic and atraumatic.  Eyes: Pupils are equal, round, and reactive to light. Conjunctivae and EOM are normal.  Neck: Normal range of motion. Neck supple.  Cardiovascular: Normal rate, regular rhythm and normal heart sounds.  Pulmonary/Chest: Effort normal and breath sounds normal.  Abdominal: Soft. Bowel sounds are normal.  Lymphadenopathy:    He has no cervical adenopathy.  Neurological: He is alert and oriented to person, place, and time.  Skin: Skin is warm and dry. Capillary refill takes less than 2 seconds.  Psychiatric: He has a normal mood and affect. His behavior is normal.  Nursing note and vitals reviewed.    Musculoskeletal Exam: C-spine good ROM.  Lumbar ROM limited.  Shoulder joints, wrist joints, MCPs, PIPs, and DIPs good ROM with no synovitis.  Complete fist formation bilaterally.  PIP and DIP synovial thickening.  Hip joints, knee joints, ankle joints, MTPs, PIPs, and DIPs good ROM with no synovitis.  No warmth or effusion of knee joints noted. No right ankle tenderness but swelling noted on medial aspect.  Tenderness at base of right achilles tendon. Pedal edema bilaterally.  No tenderness of trochanteric bursa bilaterally.   CDAI Exam: CDAI Score: Not documented Patient Global Assessment: Not documented; Provider Global Assessment: Not documented Swollen: Not documented; Tender: Not documented Joint Exam   Not documented   There is currently no information documented on the homunculus. Go to the Rheumatology activity and  complete the homunculus joint exam.  Investigation: No additional findings.  Imaging: Xr Ankle Complete Right  Result Date: 08/18/2018 No tibiotalar joint space narrowing was noted.  No erosive changes were noted.  Soft tissue swelling was noted.  Mild subtalar narrowing was noted.  A dorsal spur was noted.  A small calcaneal spur and posterior calcaneal spur was noted.   Recent Labs: Lab Results  Component Value Date   WBC 7.3 09/09/2017   HGB 14.8 09/09/2017   PLT 219 09/09/2017   NA 146 (H) 06/15/2018   K 4.4 06/15/2018   CL 108 (H) 06/15/2018   CO2 21 06/15/2018   GLUCOSE 145 (H) 06/15/2018   BUN 10 06/15/2018   CREATININE 0.85 06/15/2018   BILITOT 0.5 06/15/2018   ALKPHOS 69 06/15/2018   AST 18 06/15/2018   ALT 27 06/15/2018   PROT 6.6 06/15/2018   ALBUMIN 4.6 06/15/2018   CALCIUM 9.3 06/15/2018   GFRAA 112 06/15/2018    Speciality Comments: No specialty comments available.  Procedures:  No procedures performed Allergies: Daypro [oxaprozin] and Morphine and related   Assessment / Plan:     Visit Diagnoses: Primary osteoarthritis of both hands: He has PIP and DIP synovial thickening.  He has  intermittent bilateral CMC joint pain.  He has no tenderness on exam.  No synovitis noted. Joint protection and muscle strengthening were discussed.   Pain in right ankle and joints of right foot - He has been having right ankle pain for the past 2 months. He denies any injuries. Swelling noted on medial aspect.  No tenderness to palpation.  He has pain when weight bearing and walking.  He has been having right ankle joint stiffness.  He has been taking Celebrex 200 mg 1 tablet po daily for pain relief.  He has not tried using voltaren gel. He was given a refill of Voltaren gel.  He was also given a prescription for a lace up right ankle brace as well as right ankle exercises. A x-ray of the right ankle was obtained today that revealed mild subtalar joint space narrowing and a  dorsal spur is present. Soft tissue swelling noted. He was advised to notify us if his pain continues or worsens.  Plan: XR Ankle Complete Right   Primary osteoarthritis of both knees: No warmth or effusion noted.  He has chronic pain in both knees.  He wears knee braces on a daily basis.    DDD (degenerative disc disease), cervical:  He has good ROM on exam today.  No symptoms of radiculopathy.  He has chronic neck discomfort.   DDD (degenerative disc disease), lumbar: Chronic pain.  No midline spinal tenderness.  No radiation of pain or numbness at this time.    Neuralgia: He takes Gabapentin as prescribed.   Other medical conditions are listed as follows:   History of gastroesophageal reflux (GERD)  Type 2 diabetes mellitus without complication, without long-term current use of insulin (HCC)  Essential hypertension  History of environmental allergies  History of hypothyroidism    Orders: Orders Placed This Encounter  Procedures  . XR Ankle Complete Right   Meds ordered this encounter  Medications  . gabapentin (NEURONTIN) 100 MG capsule    Sig: TAKE 1 CAPSULE BY MOUTH IN THE MORNING, 1 CAPSULE AT NOON,  3 CAPSULES AT BEDTIME AS DIRECTED.    Dispense:  150 capsule    Refill:  2  . VOLTAREN 1 % GEL    Sig: Apply 3 grams to 3 large joints up to 3 times daily    Dispense:  3 Tube    Refill:  5    Face-to-face time spent with patient was 30 minutes. Greater than 50% of time was spent in counseling and coordination of care.  Follow-Up Instructions: Return in about 6 months (around 02/16/2019) for Osteoarthritis, DDD.   Gearldine Bienenstock, PA-C  Note - This record has been created using Dragon software.  Chart creation errors have been sought, but may not always  have been located. Such creation errors do not reflect on  the standard of medical care.

## 2018-08-09 ENCOUNTER — Other Ambulatory Visit: Payer: Self-pay | Admitting: Physician Assistant

## 2018-08-09 NOTE — Telephone Encounter (Signed)
Ok to refill 

## 2018-08-09 NOTE — Telephone Encounter (Signed)
Last Visit: 02/15/18 Next Visit: 08/18/18 Labs: 06/15/18 Glucose is 145. All other labs are stable.

## 2018-08-18 ENCOUNTER — Ambulatory Visit (INDEPENDENT_AMBULATORY_CARE_PROVIDER_SITE_OTHER): Payer: Worker's Compensation | Admitting: Physician Assistant

## 2018-08-18 ENCOUNTER — Ambulatory Visit (INDEPENDENT_AMBULATORY_CARE_PROVIDER_SITE_OTHER): Payer: Worker's Compensation

## 2018-08-18 ENCOUNTER — Encounter: Payer: Self-pay | Admitting: Physician Assistant

## 2018-08-18 VITALS — BP 131/82 | HR 65 | Resp 16 | Ht 75.0 in | Wt 302.6 lb

## 2018-08-18 DIAGNOSIS — M25571 Pain in right ankle and joints of right foot: Secondary | ICD-10-CM

## 2018-08-18 DIAGNOSIS — Z8639 Personal history of other endocrine, nutritional and metabolic disease: Secondary | ICD-10-CM

## 2018-08-18 DIAGNOSIS — M170 Bilateral primary osteoarthritis of knee: Secondary | ICD-10-CM | POA: Diagnosis not present

## 2018-08-18 DIAGNOSIS — M5136 Other intervertebral disc degeneration, lumbar region: Secondary | ICD-10-CM | POA: Diagnosis not present

## 2018-08-18 DIAGNOSIS — M19041 Primary osteoarthritis, right hand: Secondary | ICD-10-CM

## 2018-08-18 DIAGNOSIS — I10 Essential (primary) hypertension: Secondary | ICD-10-CM

## 2018-08-18 DIAGNOSIS — E119 Type 2 diabetes mellitus without complications: Secondary | ICD-10-CM

## 2018-08-18 DIAGNOSIS — M5030 Other cervical disc degeneration, unspecified cervical region: Secondary | ICD-10-CM

## 2018-08-18 DIAGNOSIS — M19042 Primary osteoarthritis, left hand: Secondary | ICD-10-CM

## 2018-08-18 DIAGNOSIS — Z8719 Personal history of other diseases of the digestive system: Secondary | ICD-10-CM

## 2018-08-18 DIAGNOSIS — Z9109 Other allergy status, other than to drugs and biological substances: Secondary | ICD-10-CM

## 2018-08-18 DIAGNOSIS — M792 Neuralgia and neuritis, unspecified: Secondary | ICD-10-CM

## 2018-08-18 MED ORDER — GABAPENTIN 100 MG PO CAPS: ORAL_CAPSULE | ORAL | 2 refills | Status: DC

## 2018-08-18 MED ORDER — VOLTAREN 1 % TD GEL: TRANSDERMAL | 5 refills | Status: DC

## 2018-08-18 NOTE — Patient Instructions (Signed)
Ankle Exercises Ask your health care provider which exercises are safe for you. Do exercises exactly as told by your health care provider and adjust them as directed. It is normal to feel mild stretching, pulling, tightness, or discomfort as you do these exercises, but you should stop right away if you feel sudden pain or your pain gets worse. Do not begin these exercises until told by your health care provider. Stretching and range of motion exercises These exercises warm up your muscles and joints and improve the movement and flexibility of your ankle. These exercises also help to relieve pain, numbness, and tingling. Exercise A: Dorsiflexion/Plantar Flexion  1. Sit with your __________ knee straight or bent. Do not rest your foot on anything. 2. Flex your __________ ankle to tilt the top of your foot toward your shin. 3. Hold this position for __________ seconds. 4. Point your toes downward to tilt the top of your foot away from your shin. 5. Hold this position for __________ seconds. Repeat __________ times. Complete this exercise __________ times a day. Exercise B: Ankle Alphabet  1. Sit with your __________ foot supported at your lower leg. ? Do not rest your foot on anything. ? Make sure your foot has room to move freely. 2. Think of your __________ foot as a paintbrush, and move your foot to trace each letter of the alphabet in the air. Keep your hip and knee still while you trace. Make the letters as large as you can without increasing any discomfort. 3. Trace every letter from A to Z. Repeat __________ times. Complete this exercise __________ times a day. Exercise C: Ankle Dorsiflexion, Passive 1. Sit on a chair that is placed on a non-carpeted surface. 2. Place your __________ foot on the floor, directly under your __________ knee. Extend your __________ leg for support. 3. Keeping your heel down, slide your __________ foot back toward the chair until you feel a stretch at your  ankle or calf. If you do not feel a stretch, slide your buttocks forward to the edge of the chair. 4. Hold this stretch for __________ seconds. Repeat __________ times. Complete this stretch __________ times a day. Strengthening exercises These exercises build strength and endurance in your ankle. Endurance is the ability to use your muscles for a long time, even after they get tired. Exercise D: Dorsiflexors  1. Secure a rubber exercise band or tube to an object, such as a table leg, that will stay still when the band is pulled. Secure the other end around your __________ foot. 2. Sit on the floor, facing the object with your __________ leg extended. The band or tube should be slightly tense when your foot is relaxed. 3. Slowly flex your __________ ankle and toes to bring your foot toward you. 4. Hold this position for __________ seconds. 5. Slowly return your foot to the starting position, controlling the band as you do that. Repeat __________ times. Complete this exercise __________ times a day. Exercise E: Plantar Flexors  1. Sit on the floor with your __________ leg extended. 2. Loop a rubber exercise band or tube around the ball of your __________ foot. The ball of your foot is on the walking surface, right under your toes. The band or tube should be slightly tense when your foot is relaxed. 3. Slowly point your toes downward, pushing them away from you. 4. Hold this position for __________ seconds. 5. Slowly release the tension in the band or tube, controlling smoothly until your foot is   back in the starting position. Repeat __________ times. Complete this exercise __________ times a day. Exercise F: Towel Curls  1. Sit in a chair on a non-carpeted surface, and put your feet on the floor. 2. Place a towel in front of your feet. If told by your health care provider, add __________ to the end of the towel. 3. Keeping your heel on the floor, put your __________ foot on the  towel. 4. Pull the towel toward you by grabbing the towel with your toes and curling them under. Keep your heel on the floor. 5. Let your toes relax. 6. Grab the towel again. Keep going until the towel is completely underneath your foot. Repeat __________ times. Complete this exercise __________ times a day. Exercise G: Heel Raise ( Plantar Flexors, Standing) 1. Stand with your feet shoulder-width apart. 2. Keep your weight spread evenly over the width of your feet while you rise up on your toes. Use a wall or table to steady yourself, but try not to use it for support. 3. If this exercise is too easy, try these options: ? Shift your weight toward your __________ leg until you feel challenged. ? If told by your health care provider, lift your uninjured leg off the floor. 4. Hold this position for __________ seconds. Repeat __________ times. Complete this exercise __________ times a day. Exercise H: Tandem Walking 1. Stand with one foot directly in front of the other. 2. Slowly raise your back foot up, lifting your heel before your toes, and place it directly in front of your other foot. 3. Continue to walk in this heel-to-toe way for __________ or for as long as told by your health care provider. Have a countertop or wall nearby to use if needed to keep your balance, but try not to hold onto anything for support. Repeat __________ times. Complete this exercises __________ times a day. This information is not intended to replace advice given to you by your health care provider. Make sure you discuss any questions you have with your health care provider. Document Released: 09/29/2005 Document Revised: 07/15/2016 Document Reviewed: 08/03/2015 Elsevier Interactive Patient Education  2018 [employer]  

## 2018-09-09 ENCOUNTER — Other Ambulatory Visit: Payer: Self-pay | Admitting: Rheumatology

## 2018-09-11 NOTE — Telephone Encounter (Signed)
Last Visit: 08/18/18 Next Visit: 02/16/19 Labs: 06/15/18 Glucose is 145. All other labs are stable.  Okay to refill per Dr. Deveshwar 

## 2018-10-12 ENCOUNTER — Other Ambulatory Visit: Payer: Self-pay | Admitting: Rheumatology

## 2018-10-12 NOTE — Telephone Encounter (Signed)
Last Visit: 08/18/18 Next Visit: 02/16/19 Labs: 06/15/18 Glucose is 145. All other labs are stable.  Okay to refill per Dr. Corliss Skainseveshwar

## 2018-11-27 ENCOUNTER — Other Ambulatory Visit: Payer: Self-pay | Admitting: Rheumatology

## 2018-11-27 NOTE — Telephone Encounter (Signed)
Last Visit: 08/18/18 Next Visit: 02/16/19  Okay to refill per Dr. Deveshwar  

## 2018-12-15 ENCOUNTER — Other Ambulatory Visit: Payer: Self-pay | Admitting: Rheumatology

## 2018-12-15 DIAGNOSIS — Z79899 Other long term (current) drug therapy: Secondary | ICD-10-CM

## 2018-12-15 NOTE — Telephone Encounter (Signed)
Last Visit: 08/18/18 Next Visit: 02/16/19 Labs: 7/18/19Glucose is 145. All other labs are stable.  Advised patient he is due for labs. Patient will update 12/18/18.  Okay to refill 30 day supply per Dr. Corliss Skains

## 2018-12-18 ENCOUNTER — Other Ambulatory Visit: Payer: Self-pay | Admitting: *Deleted

## 2018-12-18 DIAGNOSIS — Z79899 Other long term (current) drug therapy: Secondary | ICD-10-CM

## 2019-01-11 ENCOUNTER — Other Ambulatory Visit: Payer: Self-pay | Admitting: Physician Assistant

## 2019-01-11 NOTE — Telephone Encounter (Signed)
Last Visit: 08/18/18 Next Visit: 02/16/19  Okay to refill per Dr. Corliss Skains

## 2019-01-23 ENCOUNTER — Other Ambulatory Visit: Payer: Self-pay | Admitting: Rheumatology

## 2019-01-23 NOTE — Telephone Encounter (Signed)
Last Visit: 08/18/18 Next Visit: 02/16/19 Labs:01/19/19 CBC-WNL CMP- elevated glucose  Okay to refill per Dr. Corliss Skains

## 2019-02-02 NOTE — Progress Notes (Deleted)
Office Visit Note  Patient: [name] [name]             Date of Birth: July 09, 1961           MRN: [id number]             PCP: Carylon Perches, MD Referring: Carylon Perches, MD Visit Date: 02/16/2019 Occupation: @GUAROCC @  Subjective:  No chief complaint on file.   History of Present Illness: [name] [name] is a 58 y.o. male ***   Activities of Daily Living:  Patient reports morning stiffness for *** {minute/hour:19697}.   Patient {ACTIONS;DENIES/REPORTS:21021675::"Denies"} nocturnal pain.  Difficulty dressing/grooming: {ACTIONS;DENIES/REPORTS:21021675::"Denies"} Difficulty climbing stairs: {ACTIONS;DENIES/REPORTS:21021675::"Denies"} Difficulty getting out of chair: {ACTIONS;DENIES/REPORTS:21021675::"Denies"} Difficulty using hands for taps, buttons, cutlery, and/or writing: {ACTIONS;DENIES/REPORTS:21021675::"Denies"}  No Rheumatology ROS completed.   PMFS History:  Patient Active Problem List   Diagnosis Date Noted  . History of environmental allergies 12/28/2016  . Type 2 diabetes mellitus without complication, without long-term current use of insulin (HCC) 12/28/2016  . Primary osteoarthritis of both hands 12/25/2016  . Gastroesophageal reflux disease without esophagitis 12/25/2016  . Neuralgia 12/25/2016  . Primary osteoarthritis of both knees 12/24/2016  . Spondylosis of lumbar region without myelopathy or radiculopathy 12/24/2016  . DJD (degenerative joint disease), cervical 12/24/2016  . Obesity 10/08/2013  . Arthritis of both knees 10/08/2013  . LBP (low back pain) 10/08/2013  . HTN (hypertension) 10/08/2013  . Hypothyroidism 10/08/2013  . Constipation 10/04/2012    Past Medical History:  Diagnosis Date  . Arthritis   . Chronic constipation   . Constipation   . Hypertension   . Hyperthyroidism     Family History  Problem Relation Age of Onset  . Lung disease Sister    Past Surgical History:  Procedure Laterality Date  . ABDOMINAL HERNIA REPAIR     Patient  states that it was a double hernia surgery  . FOOT SURGERY    . HIATAL HERNIA REPAIR    . knee surgeries    . neck surgeries    . UPPER GASTROINTESTINAL ENDOSCOPY  1980's   Social History   Social History Narrative  . Not on file    There is no immunization history on file for this patient.   Objective: Vital Signs: There were no vitals taken for this visit.   Physical Exam   Musculoskeletal Exam: ***  CDAI Exam: CDAI Score: Not documented Patient Global Assessment: Not documented; Provider Global Assessment: Not documented Swollen: Not documented; Tender: Not documented Joint Exam   Not documented   There is currently no information documented on the homunculus. Go to the Rheumatology activity and complete the homunculus joint exam.  Investigation: No additional findings.  Imaging: No results found.  Recent Labs: Lab Results  Component Value Date   WBC 7.3 09/09/2017   HGB 14.8 09/09/2017   PLT 219 09/09/2017   NA 146 (H) 06/15/2018   K 4.4 06/15/2018   CL 108 (H) 06/15/2018   CO2 21 06/15/2018   GLUCOSE 145 (H) 06/15/2018   BUN 10 06/15/2018   CREATININE 0.85 06/15/2018   BILITOT 0.5 06/15/2018   ALKPHOS 69 06/15/2018   AST 18 06/15/2018   ALT 27 06/15/2018   PROT 6.6 06/15/2018   ALBUMIN 4.6 06/15/2018   CALCIUM 9.3 06/15/2018   GFRAA 112 06/15/2018    Speciality Comments: No specialty comments available.  Procedures:  No procedures performed Allergies: Daypro [oxaprozin] and Morphine and related   Assessment / Plan:     Visit  Diagnoses: Primary osteoarthritis of both hands  Primary osteoarthritis of both knees  DDD (degenerative disc disease), cervical  DDD (degenerative disc disease), lumbar  Spondylosis of lumbar region without myelopathy or radiculopathy  History of gastroesophageal reflux (GERD)  Type 2 diabetes mellitus without complication, without long-term current use of insulin (HCC)  Essential hypertension  History of  environmental allergies  History of hypothyroidism   Orders: No orders of the defined types were placed in this encounter.  No orders of the defined types were placed in this encounter.   Face-to-face time spent with patient was *** minutes. Greater than 50% of time was spent in counseling and coordination of care.  Follow-Up Instructions: No follow-ups on file.   Gearldine Bienenstock, PA-C  Note - This record has been created using Dragon software.  Chart creation errors have been sought, but may not always  have been located. Such creation errors do not reflect on  the standard of medical care.

## 2019-02-14 NOTE — Progress Notes (Signed)
Office Visit Note  Patient: [name] [name]             Date of Birth: Jan 19, 1961           MRN: [id number]             PCP: Carylon Perches, MD Referring: Carylon Perches, MD Visit Date: 02/15/2019 Occupation: @  Subjective:  Bilateral knee joint pain   History of Present Illness: [name] [name] is a 58 y.o. male with history of osteoarthritis and DDD.  He presents today with chronic pain in bilateral knee joints and his lower back.  He continues to use Voltaren gel topically for bilateral knee joints.  He also wears an Ace wrap and compression sleeves on both knees during the day.  He states that the knee pain is depending on his level of activity.  He reports he has intermittent swelling in his hands depending on his level activity as well.  He continues to try to perform exercises to help with his lower back stiffness and discomfort.  He sees Dr. Madelon Lips on a as needed basis.  He denies any other joint pain or joint swelling at this time.  He continues take Flexeril as prescribed.     Activities of Daily Living:  Patient reports morning stiffness for 1 hours.   Patient Reports nocturnal pain.  Difficulty dressing/grooming: Denies Difficulty climbing stairs: Reports Difficulty getting out of chair: Reports Difficulty using hands for taps, buttons, cutlery, and/or writing: Reports  Review of Systems  Constitutional: Positive for fever.  HENT: Positive for mouth dryness.   Eyes: Positive for dryness.  Respiratory: Positive for shortness of breath.   Cardiovascular: Positive for swelling in legs/feet.  Gastrointestinal: Positive for constipation.  Endocrine: Positive for excessive thirst and increased urination.  Genitourinary: Negative for difficulty urinating.  Musculoskeletal: Positive for arthralgias, gait problem, joint pain, joint swelling, muscle weakness, morning stiffness and muscle tenderness.  Skin: Negative for rash.  Allergic/Immunologic: Negative for susceptible to  infections.  Neurological: Positive for numbness.  Hematological: Positive for bruising/bleeding tendency.  Psychiatric/Behavioral: Negative for sleep disturbance.    PMFS History:  Patient Active Problem List   Diagnosis Date Noted  . History of environmental allergies 12/28/2016  . Type 2 diabetes mellitus without complication, without long-term current use of insulin (HCC) 12/28/2016  . Primary osteoarthritis of both hands 12/25/2016  . Gastroesophageal reflux disease without esophagitis 12/25/2016  . Neuralgia 12/25/2016  . Primary osteoarthritis of both knees 12/24/2016  . Spondylosis of lumbar region without myelopathy or radiculopathy 12/24/2016  . DJD (degenerative joint disease), cervical 12/24/2016  . Obesity 10/08/2013  . Arthritis of both knees 10/08/2013  . LBP (low back pain) 10/08/2013  . HTN (hypertension) 10/08/2013  . Hypothyroidism 10/08/2013  . Constipation 10/04/2012    Past Medical History:  Diagnosis Date  . Arthritis   . Chronic constipation   . Constipation   . Hypertension   . Hyperthyroidism     Family History  Problem Relation Age of Onset  . Lung disease Sister    Past Surgical History:  Procedure Laterality Date  . ABDOMINAL HERNIA REPAIR     Patient states that it was a double hernia surgery  . FOOT SURGERY    . HIATAL HERNIA REPAIR    . knee surgeries    . neck surgeries    . UPPER GASTROINTESTINAL ENDOSCOPY  1980's   Social History   Social History Narrative  . Not on file    There  is no immunization history on file for this patient.   Objective: Vital Signs: BP (!) 155/95 (BP Location: Left Arm, Patient Position: Sitting, Cuff Size: Normal)   Pulse 89   Resp 16   Ht 6\' 3"  (1.905 m)   Wt (!) 302 lb (137 kg)   BMI 37.75 kg/m    Physical Exam Vitals signs and nursing note reviewed.  Constitutional:      Appearance: He is well-developed.  HENT:     Head: Normocephalic and atraumatic.  Eyes:     Conjunctiva/sclera:  Conjunctivae normal.     Pupils: Pupils are equal, round, and reactive to light.  Neck:     Musculoskeletal: Normal range of motion and neck supple.  Cardiovascular:     Rate and Rhythm: Normal rate and regular rhythm.     Heart sounds: Normal heart sounds.  Pulmonary:     Effort: Pulmonary effort is normal.     Breath sounds: Normal breath sounds.  Abdominal:     General: Bowel sounds are normal.     Palpations: Abdomen is soft.  Lymphadenopathy:     Cervical: No cervical adenopathy.  Skin:    General: Skin is warm and dry.     Capillary Refill: Capillary refill takes less than 2 seconds.  Neurological:     Mental Status: He is alert and oriented to person, place, and time.  Psychiatric:        Behavior: Behavior normal.      Musculoskeletal Exam: C-spine, thoracic spine, and lumbar spine imited range of motion with some discomfort.  Shoulder joints, with joints, strength, MCPs and PIPs, DIPs good range of motion with no synovitis.  He has PIP and DIP synovial thickening consistent with osteoarthritis of bilateral hands.  He has complete fist formation bilaterally.  Hip joints good range of motion with no discomfort.  Knee joints have full range of motion with no warmth or effusion.  No tenderness over trochanter bursa bilaterally.  CDAI Exam: CDAI Score: Not documented Patient Global Assessment: Not documented; Provider Global Assessment: Not documented Swollen: Not documented; Tender: Not documented Joint Exam   Not documented   There is currently no information documented on the homunculus. Go to the Rheumatology activity and complete the homunculus joint exam.  Investigation: No additional findings.  Imaging: No results found.  Recent Labs: Lab Results  Component Value Date   WBC 7.3 09/09/2017   HGB 14.8 09/09/2017   PLT 219 09/09/2017   NA 146 (H) 06/15/2018   K 4.4 06/15/2018   CL 108 (H) 06/15/2018   CO2 21 06/15/2018   GLUCOSE 145 (H) 06/15/2018   BUN  10 06/15/2018   CREATININE 0.85 06/15/2018   BILITOT 0.5 06/15/2018   ALKPHOS 69 06/15/2018   AST 18 06/15/2018   ALT 27 06/15/2018   PROT 6.6 06/15/2018   ALBUMIN 4.6 06/15/2018   CALCIUM 9.3 06/15/2018   GFRAA 112 06/15/2018    Speciality Comments: No specialty comments available.  Procedures:  No procedures performed Allergies: Daypro [oxaprozin] and Morphine and related   Assessment / Plan:     Visit Diagnoses: Primary osteoarthritis of both hands: He has PIP and DIP synovial thickening consistent with osteoarthritis of bilateral hands.  No tenderness or synovitis on exam.  He has complete fist formation bilaterally.  Joint protection and muscle strengthening were discussed.  Primary osteoarthritis of both knees: No warmth or effusion noted.  He has good range of motion.  He has chronic pain of bilateral  knee joints.  He uses Voltaren gel topically twice daily and wears Ace bandage and compression sleeves on bilateral knee joints throughout the day.  He does not need any refills of Voltaren gel at this time.  He continues to take Celebrex as prescribed for pain relief.   DDD (degenerative disc disease), cervical: He is slightly limited range of motion without rotation.  He is no symptoms of radiculopathy at this time.  DDD (degenerative disc disease), lumbar: Chronic pain.  He has been noticing increased lower back pain and stiffness recently.  He denies any symptoms of radiculopathy at this time.  Denies any weakness.  He takes Flexeril 10 mg 1 tablet by mouth at bedtime to help with muscle spasms.  He continues to take Celebrex as prescribed for pain relief.  We will obtain recent lab work drawn by his PCP prior to refilling Celebrex.  We discussed the risks associated with taking NSAIDs during the coronavirus pandemic.   Other medical conditions are listed as follows:  Neuralgia  History of hypothyroidism  Essential hypertension  Type 2 diabetes mellitus without  complication, without long-term current use of insulin (HCC)  History of gastroesophageal reflux (GERD)  History of environmental allergies   Orders: No orders of the defined types were placed in this encounter.  No orders of the defined types were placed in this encounter.    Follow-Up Instructions: Return in about 6 months (around 08/18/2019) for Osteoarthritis.   Gearldine Bienenstock, PA-C  Note - This record has been created using Dragon software.  Chart creation errors have been sought, but may not always  have been located. Such creation errors do not reflect on  the standard of medical care.

## 2019-02-15 ENCOUNTER — Ambulatory Visit (INDEPENDENT_AMBULATORY_CARE_PROVIDER_SITE_OTHER): Payer: Worker's Compensation | Admitting: Physician Assistant

## 2019-02-15 ENCOUNTER — Other Ambulatory Visit: Payer: Self-pay

## 2019-02-15 ENCOUNTER — Encounter: Payer: Self-pay | Admitting: Physician Assistant

## 2019-02-15 VITALS — BP 155/95 | HR 89 | Resp 16 | Ht 75.0 in | Wt 302.0 lb

## 2019-02-15 DIAGNOSIS — M5136 Other intervertebral disc degeneration, lumbar region: Secondary | ICD-10-CM

## 2019-02-15 DIAGNOSIS — M792 Neuralgia and neuritis, unspecified: Secondary | ICD-10-CM

## 2019-02-15 DIAGNOSIS — M5030 Other cervical disc degeneration, unspecified cervical region: Secondary | ICD-10-CM | POA: Diagnosis not present

## 2019-02-15 DIAGNOSIS — I10 Essential (primary) hypertension: Secondary | ICD-10-CM

## 2019-02-15 DIAGNOSIS — Z8639 Personal history of other endocrine, nutritional and metabolic disease: Secondary | ICD-10-CM

## 2019-02-15 DIAGNOSIS — M19041 Primary osteoarthritis, right hand: Secondary | ICD-10-CM

## 2019-02-15 DIAGNOSIS — M170 Bilateral primary osteoarthritis of knee: Secondary | ICD-10-CM | POA: Diagnosis not present

## 2019-02-15 DIAGNOSIS — E119 Type 2 diabetes mellitus without complications: Secondary | ICD-10-CM

## 2019-02-15 DIAGNOSIS — Z9109 Other allergy status, other than to drugs and biological substances: Secondary | ICD-10-CM

## 2019-02-15 DIAGNOSIS — Z8719 Personal history of other diseases of the digestive system: Secondary | ICD-10-CM

## 2019-02-15 DIAGNOSIS — M19042 Primary osteoarthritis, left hand: Secondary | ICD-10-CM

## 2019-02-16 ENCOUNTER — Ambulatory Visit: Payer: Self-pay | Admitting: Physician Assistant

## 2019-02-28 ENCOUNTER — Other Ambulatory Visit: Payer: Self-pay | Admitting: Rheumatology

## 2019-02-28 NOTE — Telephone Encounter (Signed)
Last Visit: 02/15/2019 Next Visit: 08/16/2019  Okay to refill per Dr. Deveshwar 

## 2019-06-04 ENCOUNTER — Telehealth: Payer: Self-pay | Admitting: *Deleted

## 2019-06-04 MED ORDER — VOLTAREN 1 % TD GEL: TRANSDERMAL | 2 refills | Status: DC

## 2019-06-04 NOTE — Telephone Encounter (Signed)
Patient contacted the office asking if now that Voltaren Gel was over the counter if Worker's [employer] would pay for the brand name verus the generic. Patient advised he would have to contact worker's comp and ask them. Patient asked if we could send in a new prescription asking for brand name to see if they may cover it. Please advise.  Last Visit: 02/15/2019 Next Visit: 08/16/2019

## 2019-06-04 NOTE — Telephone Encounter (Signed)
Ok to send in requested prescription.

## 2019-06-04 NOTE — Addendum Note (Signed)
Addended by: Carole Binning on: 06/04/2019 03:16 PM   Modules accepted: Orders

## 2019-06-06 ENCOUNTER — Other Ambulatory Visit: Payer: Self-pay | Admitting: Physician Assistant

## 2019-06-06 NOTE — Telephone Encounter (Signed)
Last Visit: 02/15/2019 Next Visit: 08/16/2019  Okay to refill per Dr. Estanislado Pandy

## 2019-06-12 ENCOUNTER — Other Ambulatory Visit: Payer: Self-pay | Admitting: Rheumatology

## 2019-06-12 NOTE — Telephone Encounter (Signed)
Last Visit: 02/15/2019 Next Visit: 08/16/2019 Labs: 01/19/19 Elevated glucose   Okay to refill per Dr. Estanislado Pandy

## 2019-06-26 ENCOUNTER — Telehealth: Payer: Self-pay | Admitting: Rheumatology

## 2019-06-26 NOTE — Telephone Encounter (Signed)
Fatima, adjustor from [facility] left a voicemail requesting a return call regarding patient's medication of Celebrex.   Claim #[id number]    Please call back at [phone]

## 2019-06-27 NOTE — Telephone Encounter (Signed)
Attempted to contact [facility] and left message for her to return call regarding patient.

## 2019-07-16 ENCOUNTER — Other Ambulatory Visit: Payer: Self-pay | Admitting: Rheumatology

## 2019-07-16 NOTE — Telephone Encounter (Signed)
Last Visit: 02/15/19 Next Visit: 08/16/19 Labs: 01/19/19 Elevated glucose 136  Okay to refill per Dr. Estanislado Pandy

## 2019-07-30 ENCOUNTER — Other Ambulatory Visit: Payer: Self-pay | Admitting: Rheumatology

## 2019-07-30 NOTE — Telephone Encounter (Signed)
Last Visit: 02/15/19 Next Visit: 08/16/19  Okay to refill per Dr. Estanislado Pandy

## 2019-08-02 NOTE — Progress Notes (Signed)
Office Visit Note  Patient: [name] [name]             Date of Birth: 1961-07-18           MRN: [id number]             PCP: Asencion Noble, MD Referring: Asencion Noble, MD Visit Date: 08/16/2019 Occupation: @GUAROCC @  Subjective:  Increased joint and muscle pain.   History of Present Illness: [name] [name] is a 58 y.o. male with history of osteoarthritis and degenerative disc disease.  He states he has been doing a lot of yard work this summer.  He has been experiencing increased pain and discomfort in his joints which involves mostly his hands and his lower back.  He states he does take Flexeril only 1/4 tablet as needed for increased lower back pain.  He is aware of the side effects of Celebrex.  He has been taking Celebrex only once a day and sometimes twice a day.  He is careful about taking Celebrex with his meals.  He has been taking gabapentin 2 tablets at bedtime only.  Activities of Daily Living:  Patient reports morning stiffness for 0 minutes.   Patient Denies nocturnal pain.  Difficulty dressing/grooming: Denies Difficulty climbing stairs: Reports Difficulty getting out of chair: Reports Difficulty using hands for taps, buttons, cutlery, and/or writing: Reports  Review of Systems  Constitutional: Negative for fatigue and night sweats.  HENT: Negative for mouth sores, mouth dryness and nose dryness.   Eyes: Positive for dryness. Negative for redness and itching.  Respiratory: Negative for shortness of breath, wheezing and difficulty breathing.   Cardiovascular: Negative for chest pain, palpitations, hypertension, irregular heartbeat and swelling in legs/feet.  Gastrointestinal: Negative for abdominal pain, blood in stool, constipation and diarrhea.  Endocrine: Negative for increased urination.  Genitourinary: Negative for difficulty urinating and painful urination.  Musculoskeletal: Positive for arthralgias, joint pain and joint swelling. Negative for myalgias, muscle  weakness, morning stiffness, muscle tenderness and myalgias.  Skin: Negative for color change, rash, hair loss, nodules/bumps, skin tightness, ulcers and sensitivity to sunlight.  Allergic/Immunologic: Negative for susceptible to infections.  Neurological: Positive for numbness. Negative for dizziness, fainting, headaches, memory loss, night sweats and weakness.  Hematological: Negative for bruising/bleeding tendency and swollen glands.  Psychiatric/Behavioral: Negative for depressed mood, confusion and sleep disturbance. The patient is not nervous/anxious.     PMFS History:  Patient Active Problem List   Diagnosis Date Noted  . History of environmental allergies 12/28/2016  . Type 2 diabetes mellitus without complication, without long-term current use of insulin ([facility]) 12/28/2016  . Primary osteoarthritis of both hands 12/25/2016  . Gastroesophageal reflux disease without esophagitis 12/25/2016  . Neuralgia 12/25/2016  . Primary osteoarthritis of both knees 12/24/2016  . Spondylosis of lumbar region without myelopathy or radiculopathy 12/24/2016  . DJD (degenerative joint disease), cervical 12/24/2016  . Obesity 10/08/2013  . Arthritis of both knees 10/08/2013  . LBP (low back pain) 10/08/2013  . HTN (hypertension) 10/08/2013  . Hypothyroidism 10/08/2013  . Constipation 10/04/2012    Past Medical History:  Diagnosis Date  . Arthritis   . Chronic constipation   . Constipation   . Hypertension   . Hyperthyroidism     Family History  Problem Relation Age of Onset  . Lung disease Sister    Past Surgical History:  Procedure Laterality Date  . ABDOMINAL HERNIA REPAIR     Patient states that it was a double hernia surgery  . FOOT  SURGERY    . HIATAL HERNIA REPAIR    . knee surgeries    . neck surgeries    . UPPER GASTROINTESTINAL ENDOSCOPY  1980's   Social History   Social History Narrative  . Not on file    There is no immunization history on file for this patient.    Objective: Vital Signs: BP (!) 141/87 (BP Location: Right Arm, Patient Position: Sitting, Cuff Size: Normal)   Pulse 67   Resp 16   Ht 6' 3"  (1.905 m)   Wt 298 lb (135.2 kg)   BMI 37.25 kg/m    Physical Exam Vitals signs and nursing note reviewed.  Constitutional:      Appearance: He is well-developed.  HENT:     Head: Normocephalic and atraumatic.  Eyes:     Conjunctiva/sclera: Conjunctivae normal.     Pupils: Pupils are equal, round, and reactive to light.  Neck:     Musculoskeletal: Normal range of motion and neck supple.  Cardiovascular:     Rate and Rhythm: Normal rate and regular rhythm.     Heart sounds: Normal heart sounds.  Pulmonary:     Effort: Pulmonary effort is normal.     Breath sounds: Normal breath sounds.  Abdominal:     General: Bowel sounds are normal.     Palpations: Abdomen is soft.  Skin:    General: Skin is warm and dry.     Capillary Refill: Capillary refill takes less than 2 seconds.  Neurological:     Mental Status: He is alert and oriented to person, place, and time.  Psychiatric:        Behavior: Behavior normal.      Musculoskeletal Exam: He had good range of motion of his cervical spine and painful limited range of motion of his lumbar spine.  Shoulder joints and elbow joints with good range of motion.  He has DIP and PIP thickening with discomfort on palpation.  He had painful range of motion of bilateral knee joints.  No warmth swelling or effusion was noted.  CDAI Exam: CDAI Score: - Patient Global: -; Provider Global: - Swollen: -; Tender: - Joint Exam   No joint exam has been documented for this visit   There is currently no information documented on the homunculus. Go to the Rheumatology activity and complete the homunculus joint exam.  Investigation: No additional findings.  Imaging: No results found.  Recent Labs: Lab Results  Component Value Date   WBC 7.3 09/09/2017   HGB 14.8 09/09/2017   PLT 219 09/09/2017   NA  146 (H) 06/15/2018   K 4.4 06/15/2018   CL 108 (H) 06/15/2018   CO2 21 06/15/2018   GLUCOSE 145 (H) 06/15/2018   BUN 10 06/15/2018   CREATININE 0.85 06/15/2018   BILITOT 0.5 06/15/2018   ALKPHOS 69 06/15/2018   AST 18 06/15/2018   ALT 27 06/15/2018   PROT 6.6 06/15/2018   ALBUMIN 4.6 06/15/2018   CALCIUM 9.3 06/15/2018   GFRAA 112 06/15/2018    Speciality Comments: No specialty comments available.  Procedures:  No procedures performed Allergies: Daypro [oxaprozin] and Morphine and related   Assessment / Plan:     Visit Diagnoses: Primary osteoarthritis of both hands-he continues to have pain and discomfort in his bilateral hands.  He has been doing a lot of yard work which is causing increased pain.  He has been still taking Celebrex 200 mg p.o. twice daily.  He states he takes at least  1 a day and sometimes twice a day.  Side effects of Celebrex were discussed at length again including increased risk of CVD, gastritis, GI bleed, hepatic and renal toxicity.  He had normal labs in February.  I have advised him to get labs again.  He will get them at [facility]  Primary osteoarthritis of both knees-he has chronic pain and discomfort in his knee joints.  DDD (degenerative disc disease), cervical-chronic pain  DDD (degenerative disc disease), lumbar -he continues to have lot of discomfort in his lumbar spine.  He has limited range of motion.  He takes gabapentin 100 mg 2 tablets p.o. nightly, flexeril 1/4 tablet as needed, celebrex 200 mg 1 to 2 tablets as needed.  Neuralgia-gabapentin has been helpful.  Medication monitoring encounter - Plan: CBC with Differential/Platelet, CMP14+EGFR, CMP14+EGFR, CBC with Differential/Platelet  History of environmental allergies  Type 2 diabetes mellitus without complication, without long-term current use of insulin (HCC)  History of hypothyroidism  History of gastroesophageal reflux (GERD)  Essential hypertension-his blood pressure is  elevated.  Have advised him to monitor his blood pressure closely.  BMI 37.25-weight loss diet and exercise was emphasized.  Orders: Orders Placed This Encounter  Procedures  . CBC with Differential/Platelet  . CMP14+EGFR   Meds ordered this encounter  Medications  . cyclobenzaprine (FLEXERIL) 10 MG tablet    Sig: Take 0.5 tablets (5 mg total) by mouth as needed.    Dispense:  15 tablet    Refill:  0      Follow-Up Instructions: Return in about 6 months (around 02/13/2020) for Osteoarthritis.   Bo Merino, MD  Note - This record has been created using Editor, commissioning.  Chart creation errors have been sought, but may not always  have been located. Such creation errors do not reflect on  the standard of medical care.

## 2019-08-16 ENCOUNTER — Other Ambulatory Visit: Payer: Self-pay

## 2019-08-16 ENCOUNTER — Ambulatory Visit (INDEPENDENT_AMBULATORY_CARE_PROVIDER_SITE_OTHER): Payer: Worker's Compensation | Admitting: Rheumatology

## 2019-08-16 ENCOUNTER — Encounter: Payer: Self-pay | Admitting: Physician Assistant

## 2019-08-16 VITALS — BP 141/87 | HR 67 | Resp 16 | Ht 75.0 in | Wt 298.0 lb

## 2019-08-16 DIAGNOSIS — I10 Essential (primary) hypertension: Secondary | ICD-10-CM

## 2019-08-16 DIAGNOSIS — M5030 Other cervical disc degeneration, unspecified cervical region: Secondary | ICD-10-CM

## 2019-08-16 DIAGNOSIS — M170 Bilateral primary osteoarthritis of knee: Secondary | ICD-10-CM

## 2019-08-16 DIAGNOSIS — Z8639 Personal history of other endocrine, nutritional and metabolic disease: Secondary | ICD-10-CM

## 2019-08-16 DIAGNOSIS — M19042 Primary osteoarthritis, left hand: Secondary | ICD-10-CM

## 2019-08-16 DIAGNOSIS — M5136 Other intervertebral disc degeneration, lumbar region: Secondary | ICD-10-CM

## 2019-08-16 DIAGNOSIS — M51369 Other intervertebral disc degeneration, lumbar region without mention of lumbar back pain or lower extremity pain: Secondary | ICD-10-CM

## 2019-08-16 DIAGNOSIS — Z9109 Other allergy status, other than to drugs and biological substances: Secondary | ICD-10-CM

## 2019-08-16 DIAGNOSIS — M19041 Primary osteoarthritis, right hand: Secondary | ICD-10-CM

## 2019-08-16 DIAGNOSIS — E119 Type 2 diabetes mellitus without complications: Secondary | ICD-10-CM

## 2019-08-16 DIAGNOSIS — Z8719 Personal history of other diseases of the digestive system: Secondary | ICD-10-CM

## 2019-08-16 DIAGNOSIS — Z5181 Encounter for therapeutic drug level monitoring: Secondary | ICD-10-CM

## 2019-08-16 DIAGNOSIS — M792 Neuralgia and neuritis, unspecified: Secondary | ICD-10-CM

## 2019-08-16 MED ORDER — CYCLOBENZAPRINE HCL 10 MG PO TABS: 5.0000 mg | ORAL_TABLET | ORAL | 0 refills | Status: DC | PRN

## 2019-08-26 ENCOUNTER — Other Ambulatory Visit: Payer: Self-pay | Admitting: Rheumatology

## 2019-08-27 NOTE — Telephone Encounter (Signed)
Last Visit: 08/16/19 Next Visit: 02/13/20  Okay to refill per Dr. Deveshwar  

## 2019-09-04 ENCOUNTER — Telehealth: Payer: Self-pay | Admitting: Rheumatology

## 2019-09-04 DIAGNOSIS — Z79899 Other long term (current) drug therapy: Secondary | ICD-10-CM

## 2019-09-04 MED ORDER — CELEBREX 200 MG PO CAPS: ORAL_CAPSULE | ORAL | 0 refills | Status: DC

## 2019-09-04 NOTE — Telephone Encounter (Signed)
#  1.Patient calling because he needs a refill on Celebrex sent to [facility].  #2.Patient is due for labs here, and also with PCP on the 19th. Patient would like to do labs with Dr. Willey Blade, and ours at the same time, if possible. Please call to advise.

## 2019-09-04 NOTE — Telephone Encounter (Signed)
1. Last Visit: 08/16/19     Next Visit: 02/13/20     Labs: 01/19/19 Elevated Glucose      Okay to refill per Dr. Estanislado Pandy   2.Patient advised lab orders faxed to PCP so he may have them done with PCP.

## 2019-09-18 LAB — CMP14+EGFR
ALT: 21 IU/L (ref 0–44)
AST: 17 IU/L (ref 0–40)
Albumin/Globulin Ratio: 2.3 — ABNORMAL HIGH (ref 1.2–2.2)
Albumin: 4.6 g/dL (ref 3.8–4.9)
Alkaline Phosphatase: 71 IU/L (ref 39–117)
BUN/Creatinine Ratio: 15 (ref 9–20)
BUN: 13 mg/dL (ref 6–24)
Bilirubin Total: 0.6 mg/dL (ref 0.0–1.2)
CO2: 21 mmol/L (ref 20–29)
Calcium: 9.5 mg/dL (ref 8.7–10.2)
Chloride: 106 mmol/L (ref 96–106)
Creatinine, Ser: 0.85 mg/dL (ref 0.76–1.27)
GFR calc Af Amer: 110 mL/min/{1.73_m2} (ref >59)
GFR calc non Af Amer: 95 mL/min/{1.73_m2} (ref >59)
Globulin, Total: 2 g/dL (ref 1.5–4.5)
Glucose: 144 mg/dL — ABNORMAL HIGH (ref 65–99)
Potassium: 4.2 mmol/L (ref 3.5–5.2)
Sodium: 142 mmol/L (ref 134–144)
Total Protein: 6.6 g/dL (ref 6.0–8.5)

## 2019-09-18 LAB — CBC WITH DIFFERENTIAL/PLATELET
Basophils Absolute: 0 10*3/uL (ref 0.0–0.2)
Basos: 1 %
EOS (ABSOLUTE): 0.1 10*3/uL (ref 0.0–0.4)
Eos: 1 %
Hematocrit: 42.8 % (ref 37.5–51.0)
Hemoglobin: 13.9 g/dL (ref 13.0–17.7)
Immature Grans (Abs): 0 10*3/uL (ref 0.0–0.1)
Immature Granulocytes: 0 %
Lymphocytes Absolute: 2.2 10*3/uL (ref 0.7–3.1)
Lymphs: 35 %
MCH: 28.8 pg (ref 26.6–33.0)
MCHC: 32.5 g/dL (ref 31.5–35.7)
MCV: 89 fL (ref 79–97)
Monocytes Absolute: 0.5 10*3/uL (ref 0.1–0.9)
Monocytes: 8 %
Neutrophils Absolute: 3.6 10*3/uL (ref 1.4–7.0)
Neutrophils: 55 %
Platelets: 207 10*3/uL (ref 150–450)
RBC: 4.83 x10E6/uL (ref 4.14–5.80)
RDW: 11.9 % (ref 11.6–15.4)
WBC: 6.5 10*3/uL (ref 3.4–10.8)

## 2019-09-18 NOTE — Progress Notes (Signed)
Glucose is mildly elevated.  Rest the labs are stable.

## 2019-10-08 ENCOUNTER — Other Ambulatory Visit: Payer: Self-pay | Admitting: Rheumatology

## 2019-10-09 NOTE — Telephone Encounter (Signed)
Last Visit: 08/16/19 Next Visit: 02/13/20 Labs: 09/17/19  Glucose is mildly elevated. Rest the labs are stable.   Okay to refill per Dr. Estanislado Pandy

## 2019-11-06 ENCOUNTER — Other Ambulatory Visit: Payer: Self-pay | Admitting: Rheumatology

## 2019-11-06 NOTE — Telephone Encounter (Signed)
Last Visit: 08/16/2019 Next Visit:02/13/2020  Okay to refill per Dr. Deveshwar.  

## 2019-12-18 ENCOUNTER — Other Ambulatory Visit: Payer: Self-pay | Admitting: Rheumatology

## 2019-12-19 NOTE — Telephone Encounter (Signed)
Last Visit: 08/16/19 Next Visit: 02/13/20  Okay to refill per Dr. Corliss Skains

## 2020-01-02 ENCOUNTER — Other Ambulatory Visit: Payer: Self-pay | Admitting: Rheumatology

## 2020-01-03 NOTE — Telephone Encounter (Signed)
Last Visit: 08/16/2019 Next Visit:02/13/2020  Okay to refill per Dr. Corliss Skains.

## 2020-01-09 ENCOUNTER — Other Ambulatory Visit: Payer: Self-pay | Admitting: Rheumatology

## 2020-01-09 NOTE — Telephone Encounter (Signed)
Last Visit:08/16/2019 Next Visit:02/13/2020 Labs: 09/17/19 Glucose is mildly elevated. Rest the labs are stable.  Okay to refill per Dr. Deveshwar  

## 2020-02-06 NOTE — Progress Notes (Signed)
Office Visit Note  Patient: [name] [name]             Date of Birth: 27-Mar-1961           MRN: [id number]             PCP: Carylon Perches, MD Referring: Carylon Perches, MD Visit Date: 02/13/2020 Occupation: @GUAROCC @  Subjective:  Pain in multiple joints  History of Present Illness: TACUMA GRAFFAM is a 59 y.o. male with history of osteoarthritis and DDD.  Patient reports he continues to have pain in multiple joints including bilateral wrists, bilateral hands, bilateral knees, bilateral ankle joints.  He states he notices intermittent swelling in these joints.  He uses Voltaren gel several times a day for pain relief.  He continues to take Celebrex 200 mg 1 tablet twice daily. He states He states that his joint pain is directly correlated with his level of activity.  He takes Flexeril 0.5 mg as needed for muscle spasms.  He continues to have chronic neck pain.  He states that his neck pain is exacerbated by sleeping in the wrong position.  He states that he has been sleeping better at night since his primary care change his blood pressure medication dose.  He has been sleeping about 5-1/2 hours per night.   Activities of Daily Living:  Patient reports joint stiffness all day Patient Denies nocturnal pain.  Difficulty dressing/grooming: Denies Difficulty climbing stairs: Reports Difficulty getting out of chair: Reports Difficulty using hands for taps, buttons, cutlery, and/or writing: Reports  Review of Systems  Constitutional: Positive for fatigue. Negative for night sweats.  HENT: Positive for mouth dryness. Negative for mouth sores and nose dryness.   Eyes: Positive for dryness. Negative for redness.  Respiratory: Negative for shortness of breath and difficulty breathing.   Cardiovascular: Negative for chest pain, palpitations, hypertension, irregular heartbeat and swelling in legs/feet.  Gastrointestinal: Negative for blood in stool, constipation and diarrhea.  Endocrine: Negative for  increased urination.  Genitourinary: Negative for difficulty urinating and painful urination.  Musculoskeletal: Positive for arthralgias, joint pain, joint swelling and morning stiffness. Negative for myalgias, muscle weakness, muscle tenderness and myalgias.  Skin: Negative for color change, rash, hair loss, nodules/bumps, skin tightness, ulcers and sensitivity to sunlight.  Allergic/Immunologic: Negative for susceptible to infections.  Neurological: Negative for dizziness, fainting, numbness, memory loss, night sweats and weakness.  Hematological: Negative for swollen glands.  Psychiatric/Behavioral: Negative for depressed mood, confusion and sleep disturbance. The patient is not nervous/anxious.     PMFS History:  Patient Active Problem List   Diagnosis Date Noted   History of environmental allergies 12/28/2016   Type 2 diabetes mellitus without complication, without long-term current use of insulin ([facility]) 12/28/2016   Primary osteoarthritis of both hands 12/25/2016   Gastroesophageal reflux disease without esophagitis 12/25/2016   Neuralgia 12/25/2016   Primary osteoarthritis of both knees 12/24/2016   Spondylosis of lumbar region without myelopathy or radiculopathy 12/24/2016   DJD (degenerative joint disease), cervical 12/24/2016   Obesity 10/08/2013   Arthritis of both knees 10/08/2013   LBP (low back pain) 10/08/2013   HTN (hypertension) 10/08/2013   Hypothyroidism 10/08/2013   Constipation 10/04/2012    Past Medical History:  Diagnosis Date   Arthritis    Chronic constipation    Constipation    Hypertension    Hyperthyroidism     Family History  Problem Relation Age of Onset   Lung disease Sister    Past Surgical History:  Procedure Laterality Date   ABDOMINAL HERNIA REPAIR     Patient states that it was a double hernia surgery   FOOT SURGERY     HIATAL HERNIA REPAIR     knee surgeries     neck surgeries     UPPER GASTROINTESTINAL  ENDOSCOPY  1980's   Social History   Social History Narrative   Not on file    There is no immunization history on file for this patient.   Objective: Vital Signs: BP 133/84 (BP Location: Left Arm, Patient Position: Sitting, Cuff Size: Normal)    Pulse 88    Resp 16    Ht 6\' 3"  (1.905 m)    Wt (!) 301 lb (136.5 kg)    BMI 37.62 kg/m    Physical Exam Vitals and nursing note reviewed.  Constitutional:      Appearance: He is well-developed.  HENT:     Head: Normocephalic and atraumatic.  Eyes:     Conjunctiva/sclera: Conjunctivae normal.     Pupils: Pupils are equal, round, and reactive to light.  Pulmonary:     Effort: Pulmonary effort is normal.  Abdominal:     General: Bowel sounds are normal.     Palpations: Abdomen is soft.  Musculoskeletal:     Cervical back: Normal range of motion and neck supple.  Skin:    General: Skin is warm and dry.     Capillary Refill: Capillary refill takes less than 2 seconds.  Neurological:     Mental Status: He is alert and oriented to person, place, and time.  Psychiatric:        Behavior: Behavior normal.      Musculoskeletal Exam: C-spine, thoracic spine, lumbar spine good range of motion.  No midline spinal tenderness.  Shoulder joints, elbow joints, wrist joints, MCPs, PIPs, DIPs good range of motion with no synovitis.  He has PIP and DIP thickening consistent with osteoarthritis of both hands.  He has complete fist formation bilaterally.  Hip joints have good range of motion with no discomfort.  Knee joints have good range of motion no warmth or effusion.  Pedal edema noted bilaterally.  CDAI Exam: CDAI Score: -- Patient Global: --; Provider Global: -- Swollen: --; Tender: -- Joint Exam 02/13/2020   No joint exam has been documented for this visit   There is currently no information documented on the homunculus. Go to the Rheumatology activity and complete the homunculus joint exam.  Investigation: No additional  findings.  Imaging: No results found.  Recent Labs: Lab Results  Component Value Date   WBC 6.5 09/17/2019   HGB 13.9 09/17/2019   PLT 207 09/17/2019   NA 142 09/17/2019   K 4.2 09/17/2019   CL 106 09/17/2019   CO2 21 09/17/2019   GLUCOSE 144 (H) 09/17/2019   BUN 13 09/17/2019   CREATININE 0.85 09/17/2019   BILITOT 0.6 09/17/2019   ALKPHOS 71 09/17/2019   AST 17 09/17/2019   ALT 21 09/17/2019   PROT 6.6 09/17/2019   ALBUMIN 4.6 09/17/2019   CALCIUM 9.5 09/17/2019   GFRAA 110 09/17/2019    Speciality Comments: No specialty comments available.  Procedures:  No procedures performed Allergies: Daypro [oxaprozin] and Morphine and related   Assessment / Plan:     Visit Diagnoses: Primary osteoarthritis of both hands: He has PIP and DIP thickening consistent with osteoarthritis of both hands.  No tenderness or synovitis was noted.  He has good range of motion of both wrist joints  with no tenderness or inflammation.  He experiences intermittent pain and swelling in both hands and both wrists.  We discussed the importance of joint protection and muscle strengthening.  He was given a handout of hand exercises to perform.  He continues to use Voltaren gel topically as needed for pain relief.  He is on Celebrex 200 mg 1 capsule twice daily for pain relief.  He does not need any refills of these medications at this time.  He has upcoming lab work with his PCP on 04/11/2020 and will have labs faxed to our office.  He was advised to notify us if he develops increased joint pain or joint swelling.  Primary osteoarthritis of both knees: He has chronic pain in both knee joints.  Crepitus noted.  No warmth or effusion was noted.  He wears Ace wraps on a daily basis.  He has difficulty walking prolonged distances as well as climbing steps or discomfort.  He uses Voltaren gel topically as needed for pain relief and continues to take Celebrex as prescribed.  He was given a handout of knee joint  exercises to perform.  He was encouraged to start riding the stationary bike and/or using the elliptical for lower extremity strengthening.  DDD (degenerative disc disease), cervical: He has good range of motion with discomfort.  He has no symptoms of radiculopathy at this time.  His neck pain is exacerbated by sleeping in incorrect positions at night.  DDD (degenerative disc disease), lumbar - Chronic pain. He gabapentin 100 mg 2 tablets p.o. nightly and celebrex 200 mg 1 to 2 tablets as needed.  He does not need any refills of these medications at this time.  Neuralgia: He is taking gabapentin 200 at bedtime.   Other medical conditions are listed as follows:   History of gastroesophageal reflux (GERD)  History of hypothyroidism  History of environmental allergies  Type 2 diabetes mellitus without complication, without long-term current use of insulin ([place])  Essential hypertension  Orders: No orders of the defined types were placed in this encounter.  No orders of the defined types were placed in this encounter.     Follow-Up Instructions: Return in about 6 months (around 08/15/2020) for Osteoarthritis, DDD.   Ofilia Neas, PA-C   I examined and evaluated the patient with Hazel Sams PA.  Patient continues to have some pain and discomfort due to underlying osteoarthritis.  He has been tolerating NSAIDs well.  We will continue to monitor labs.  The plan of care was discussed as noted above.  Bo Merino, MD  Note - This record has been created using Editor, commissioning.  Chart creation errors have been sought, but may not always  have been located. Such creation errors do not reflect on  the standard of medical care.

## 2020-02-11 ENCOUNTER — Other Ambulatory Visit: Payer: Self-pay | Admitting: Rheumatology

## 2020-02-11 NOTE — Telephone Encounter (Signed)
Last Visit:08/16/2019 Next Visit:02/13/2020 Labs: 09/17/19 Glucose is mildly elevated. Rest the labs are stable.  Okay to refill per Dr. Corliss Skains

## 2020-02-13 ENCOUNTER — Other Ambulatory Visit: Payer: Self-pay

## 2020-02-13 ENCOUNTER — Encounter: Payer: Self-pay | Admitting: Rheumatology

## 2020-02-13 ENCOUNTER — Ambulatory Visit (INDEPENDENT_AMBULATORY_CARE_PROVIDER_SITE_OTHER): Payer: Worker's Compensation | Admitting: Rheumatology

## 2020-02-13 VITALS — BP 133/84 | HR 88 | Resp 16 | Ht 75.0 in | Wt 301.0 lb

## 2020-02-13 DIAGNOSIS — Z9109 Other allergy status, other than to drugs and biological substances: Secondary | ICD-10-CM

## 2020-02-13 DIAGNOSIS — M5136 Other intervertebral disc degeneration, lumbar region: Secondary | ICD-10-CM

## 2020-02-13 DIAGNOSIS — M170 Bilateral primary osteoarthritis of knee: Secondary | ICD-10-CM

## 2020-02-13 DIAGNOSIS — M792 Neuralgia and neuritis, unspecified: Secondary | ICD-10-CM

## 2020-02-13 DIAGNOSIS — Z8719 Personal history of other diseases of the digestive system: Secondary | ICD-10-CM

## 2020-02-13 DIAGNOSIS — I10 Essential (primary) hypertension: Secondary | ICD-10-CM

## 2020-02-13 DIAGNOSIS — M5030 Other cervical disc degeneration, unspecified cervical region: Secondary | ICD-10-CM

## 2020-02-13 DIAGNOSIS — Z8639 Personal history of other endocrine, nutritional and metabolic disease: Secondary | ICD-10-CM

## 2020-02-13 DIAGNOSIS — M19042 Primary osteoarthritis, left hand: Secondary | ICD-10-CM

## 2020-02-13 DIAGNOSIS — M19041 Primary osteoarthritis, right hand: Secondary | ICD-10-CM

## 2020-02-13 DIAGNOSIS — E119 Type 2 diabetes mellitus without complications: Secondary | ICD-10-CM

## 2020-02-13 NOTE — Patient Instructions (Signed)
Journal for Nurse Practitioners, 15(4), 263-267. Retrieved September 04, 2018 from [email or web address]">  Knee Exercises Ask your health care provider which exercises are safe for you. Do exercises exactly as told by your health care provider and adjust them as directed. It is normal to feel mild stretching, pulling, tightness, or discomfort as you do these exercises. Stop right away if you feel sudden pain or your pain gets worse. Do not begin these exercises until told by your health care provider. Stretching and range-of-motion exercises These exercises warm up your muscles and joints and improve the movement and flexibility of your knee. These exercises also help to relieve pain and swelling. Knee extension, prone 1. Lie on your abdomen (prone position) on a bed. 2. Place your left / right knee just beyond the edge of the surface so your knee is not on the bed. You can put a towel under your left / right thigh just above your kneecap for comfort. 3. Relax your leg muscles and allow gravity to straighten your knee (extension). You should feel a stretch behind your left / right knee. 4. Hold this position for __________ seconds. 5. Scoot up so your knee is supported between repetitions. Repeat __________ times. Complete this exercise __________ times a day. Knee flexion, active  1. Lie on your back with both legs straight. If this causes back discomfort, bend your left / right knee so your foot is flat on the floor. 2. Slowly slide your left / right heel back toward your buttocks. Stop when you feel a gentle stretch in the front of your knee or thigh (flexion). 3. Hold this position for __________ seconds. 4. Slowly slide your left / right heel back to the starting position. Repeat __________ times. Complete this exercise __________ times a day. Quadriceps stretch, prone  1. Lie on your abdomen on a firm surface, such as a bed or padded floor. 2. Bend your left / right knee and hold  your ankle. If you cannot reach your ankle or pant leg, loop a belt around your foot and grab the belt instead. 3. Gently pull your heel toward your buttocks. Your knee should not slide out to the side. You should feel a stretch in the front of your thigh and knee (quadriceps). 4. Hold this position for __________ seconds. Repeat __________ times. Complete this exercise __________ times a day. Hamstring, supine 1. Lie on your back (supine position). 2. Loop a belt or towel over the ball of your left / right foot. The ball of your foot is on the walking surface, right under your toes. 3. Straighten your left / right knee and slowly pull on the belt to raise your leg until you feel a gentle stretch behind your knee (hamstring). ? Do not let your knee bend while you do this. ? Keep your other leg flat on the floor. 4. Hold this position for __________ seconds. Repeat __________ times. Complete this exercise __________ times a day. Strengthening exercises These exercises build strength and endurance in your knee. Endurance is the ability to use your muscles for a long time, even after they get tired. Quadriceps, isometric This exercise stretches the muscles in front of your thigh (quadriceps) without moving your knee joint (isometric). 1. Lie on your back with your left / right leg extended and your other knee bent. Put a rolled towel or small pillow under your knee if told by your health care provider. 2. Slowly tense the muscles in the front of your left /   right thigh. You should see your kneecap slide up toward your hip or see increased dimpling just above the knee. This motion will push the back of the knee toward the floor. 3. For __________ seconds, hold the muscle as tight as you can without increasing your pain. 4. Relax the muscles slowly and completely. Repeat __________ times. Complete this exercise __________ times a day. Straight leg raises This exercise stretches the muscles in front  of your thigh (quadriceps) and the muscles that move your hips (hip flexors). 1. Lie on your back with your left / right leg extended and your other knee bent. 2. Tense the muscles in the front of your left / right thigh. You should see your kneecap slide up or see increased dimpling just above the knee. Your thigh may even shake a bit. 3. Keep these muscles tight as you raise your leg 4-6 inches (10-15 cm) off the floor. Do not let your knee bend. 4. Hold this position for __________ seconds. 5. Keep these muscles tense as you lower your leg. 6. Relax your muscles slowly and completely after each repetition. Repeat __________ times. Complete this exercise __________ times a day. Hamstring, isometric 1. Lie on your back on a firm surface. 2. Bend your left / right knee about __________ degrees. 3. Dig your left / right heel into the surface as if you are trying to pull it toward your buttocks. Tighten the muscles in the back of your thighs (hamstring) to "dig" as hard as you can without increasing any pain. 4. Hold this position for __________ seconds. 5. Release the tension gradually and allow your muscles to relax completely for __________ seconds after each repetition. Repeat __________ times. Complete this exercise __________ times a day. Hamstring curls If told by your health care provider, do this exercise while wearing ankle weights. Begin with __________ lb weights. Then increase the weight by 1 lb (0.5 kg) increments. Do not wear ankle weights that are more than __________ lb. 1. Lie on your abdomen with your legs straight. 2. Bend your left / right knee as far as you can without feeling pain. Keep your hips flat against the floor. 3. Hold this position for __________ seconds. 4. Slowly lower your leg to the starting position. Repeat __________ times. Complete this exercise __________ times a day. Squats This exercise strengthens the muscles in front of your thigh and knee  (quadriceps). 1. Stand in front of a table, with your feet and knees pointing straight ahead. You may rest your hands on the table for balance but not for support. 2. Slowly bend your knees and lower your hips like you are going to sit in a chair. ? Keep your weight over your heels, not over your toes. ? Keep your lower legs upright so they are parallel with the table legs. ? Do not let your hips go lower than your knees. ? Do not bend lower than told by your health care provider. ? If your knee pain increases, do not bend as low. 3. Hold the squat position for __________ seconds. 4. Slowly push with your legs to return to standing. Do not use your hands to pull yourself to standing. Repeat __________ times. Complete this exercise __________ times a day. Wall slides This exercise strengthens the muscles in front of your thigh and knee (quadriceps). 1. Lean your back against a smooth wall or door, and walk your feet out 18-24 inches (46-61 cm) from it. 2. Place your feet hip-width apart. 3.   Slowly slide down the wall or door until your knees bend __________ degrees. Keep your knees over your heels, not over your toes. Keep your knees in line with your hips. 4. Hold this position for __________ seconds. Repeat __________ times. Complete this exercise __________ times a day. Straight leg raises This exercise strengthens the muscles that rotate the leg at the hip and move it away from your body (hip abductors). 1. Lie on your side with your left / right leg in the top position. Lie so your head, shoulder, knee, and hip line up. You may bend your bottom knee to help you keep your balance. 2. Roll your hips slightly forward so your hips are stacked directly over each other and your left / right knee is facing forward. 3. Leading with your heel, lift your top leg 4-6 inches (10-15 cm). You should feel the muscles in your outer hip lifting. ? Do not let your foot drift forward. ? Do not let your knee  roll toward the ceiling. 4. Hold this position for __________ seconds. 5. Slowly return your leg to the starting position. 6. Let your muscles relax completely after each repetition. Repeat __________ times. Complete this exercise __________ times a day. Straight leg raises This exercise stretches the muscles that move your hips away from the front of the pelvis (hip extensors). 1. Lie on your abdomen on a firm surface. You can put a pillow under your hips if that is more comfortable. 2. Tense the muscles in your buttocks and lift your left / right leg about 4-6 inches (10-15 cm). Keep your knee straight as you lift your leg. 3. Hold this position for __________ seconds. 4. Slowly lower your leg to the starting position. 5. Let your leg relax completely after each repetition. Repeat __________ times. Complete this exercise __________ times a day. This information is not intended to replace advice given to you by your health care provider. Make sure you discuss any questions you have with your health care provider. Document Revised: 09/05/2018 Document Reviewed: 09/05/2018 [employer] Patient Education  2020 [employer] Hand Exercises Hand exercises can be helpful for almost anyone. These exercises can strengthen the hands, improve flexibility and movement, and increase blood flow to the hands. These results can make work and daily tasks easier. Hand exercises can be especially helpful for people who have joint pain from arthritis or have nerve damage from overuse (carpal tunnel syndrome). These exercises can also help people who have injured a hand. Exercises Most of these hand exercises are gentle stretching and motion exercises. It is usually safe to do them often throughout the day. Warming up your hands before exercise may help to reduce stiffness. You can do this with gentle massage or by placing your hands in warm water for 10-15 minutes. It is normal to feel some stretching, pulling,  tightness, or mild discomfort as you begin new exercises. This will gradually improve. Stop an exercise right away if you feel sudden, severe pain or your pain gets worse. Ask your health care provider which exercises are best for you. Knuckle bend or "claw" fist 1. Stand or sit with your arm, hand, and all five fingers pointed straight up. Make sure to keep your wrist straight during the exercise. 2. Gently bend your fingers down toward your palm until the tips of your fingers are touching the top of your palm. Keep your big knuckle straight and just bend the small knuckles in your fingers. 3. Hold this position for   __________ seconds. 4. Straighten (extend) your fingers back to the starting position. Repeat this exercise 5-10 times with each hand. Full finger fist 1. Stand or sit with your arm, hand, and all five fingers pointed straight up. Make sure to keep your wrist straight during the exercise. 2. Gently bend your fingers into your palm until the tips of your fingers are touching the middle of your palm. 3. Hold this position for __________ seconds. 4. Extend your fingers back to the starting position, stretching every joint fully. Repeat this exercise 5-10 times with each hand. Straight fist 1. Stand or sit with your arm, hand, and all five fingers pointed straight up. Make sure to keep your wrist straight during the exercise. 2. Gently bend your fingers at the big knuckle, where your fingers meet your hand, and the middle knuckle. Keep the knuckle at the tips of your fingers straight and try to touch the bottom of your palm. 3. Hold this position for __________ seconds. 4. Extend your fingers back to the starting position, stretching every joint fully. Repeat this exercise 5-10 times with each hand. Tabletop 1. Stand or sit with your arm, hand, and all five fingers pointed straight up. Make sure to keep your wrist straight during the exercise. 2. Gently bend your fingers at the big  knuckle, where your fingers meet your hand, as far down as you can while keeping the small knuckles in your fingers straight. Think of forming a tabletop with your fingers. 3. Hold this position for __________ seconds. 4. Extend your fingers back to the starting position, stretching every joint fully. Repeat this exercise 5-10 times with each hand. Finger spread 1. Place your hand flat on a table with your palm facing down. Make sure your wrist stays straight as you do this exercise. 2. Spread your fingers and thumb apart from each other as far as you can until you feel a gentle stretch. Hold this position for __________ seconds. 3. Bring your fingers and thumb tight together again. Hold this position for __________ seconds. Repeat this exercise 5-10 times with each hand. Making circles 1. Stand or sit with your arm, hand, and all five fingers pointed straight up. Make sure to keep your wrist straight during the exercise. 2. Make a circle by touching the tip of your thumb to the tip of your index finger. 3. Hold for __________ seconds. Then open your hand wide. 4. Repeat this motion with your thumb and each finger on your hand. Repeat this exercise 5-10 times with each hand. Thumb motion 1. Sit with your forearm resting on a table and your wrist straight. Your thumb should be facing up toward the ceiling. Keep your fingers relaxed as you move your thumb. 2. Lift your thumb up as high as you can toward the ceiling. Hold for __________ seconds. 3. Bend your thumb across your palm as far as you can, reaching the tip of your thumb for the small finger (pinkie) side of your palm. Hold for __________ seconds. Repeat this exercise 5-10 times with each hand. Grip strengthening  1. Hold a stress ball or other soft ball in the middle of your hand. 2. Slowly increase the pressure, squeezing the ball as much as you can without causing pain. Think of bringing the tips of your fingers into the middle of  your palm. All of your finger joints should bend when doing this exercise. 3. Hold your squeeze for __________ seconds, then relax. Repeat this exercise 5-10 times with each   hand. Contact a health care provider if:  Your hand pain or discomfort gets much worse when you do an exercise.  Your hand pain or discomfort does not improve within 2 hours after you exercise. If you have any of these problems, stop doing these exercises right away. Do not do them again unless your health care provider says that you can. Get help right away if:  You develop sudden, severe hand pain or swelling. If this happens, stop doing these exercises right away. Do not do them again unless your health care provider says that you can. This information is not intended to replace advice given to you by your health care provider. Make sure you discuss any questions you have with your health care provider. Document Revised: 03/08/2019 Document Reviewed: 11/16/2018 Elsevier Patient Education  2020 [employer]  

## 2020-02-20 ENCOUNTER — Other Ambulatory Visit: Payer: Self-pay | Admitting: Rheumatology

## 2020-02-21 NOTE — Telephone Encounter (Signed)
Last Visit: 02/13/20 Next Visit: 08/13/20   Okay to refill per Dr. Corliss Skains

## 2020-03-17 ENCOUNTER — Other Ambulatory Visit: Payer: Self-pay | Admitting: Rheumatology

## 2020-03-18 NOTE — Telephone Encounter (Signed)
Ok to refill flexeril 10 mg 1 tablet by mouth daily as needed. Dispense 30 tablets with zero refills.   Ok to refill Celebrex. We will continue to monitor lab work every 6 months.

## 2020-03-18 NOTE — Telephone Encounter (Signed)
Last Visit: 02/13/2020 Next Visit: 08/13/2020 Labs: 09/17/2019 Glucose is mildly elevated. Rest the labs are stable.  Per office note on 02/13/2020, He has upcoming lab work with his PCP on 04/11/2020 and will have labs faxed to our office.   Okay to refill flexeril and celebrex?   also, at which dose should flexeril be refilled?

## 2020-03-25 ENCOUNTER — Encounter (INDEPENDENT_AMBULATORY_CARE_PROVIDER_SITE_OTHER): Payer: PRIVATE HEALTH INSURANCE

## 2020-03-25 DIAGNOSIS — R002 Palpitations: Secondary | ICD-10-CM

## 2020-03-26 ENCOUNTER — Other Ambulatory Visit: Payer: Self-pay | Admitting: Rheumatology

## 2020-03-26 NOTE — Telephone Encounter (Signed)
Ok to refill gabapentin 100 mg 2 capsule at bedtime.

## 2020-03-26 NOTE — Telephone Encounter (Signed)
Last Visit: 02/13/2020 Next Visit: 08/13/2020  Per office note on 02/13/2020, gabapentin 100 mg 2 tablets p.o. nightly   Okay to refill gabapentin at dose mentioned above?

## 2020-04-07 ENCOUNTER — Other Ambulatory Visit: Payer: Self-pay

## 2020-04-07 DIAGNOSIS — R002 Palpitations: Secondary | ICD-10-CM

## 2020-04-16 ENCOUNTER — Telehealth: Payer: Self-pay

## 2020-04-16 ENCOUNTER — Other Ambulatory Visit: Payer: Self-pay | Admitting: Physician Assistant

## 2020-04-16 NOTE — Telephone Encounter (Signed)
Labs received from PCPs office.   Date: 12/06/2019 CBC WNL  CMP: elevated glucose.   Okay to refill per Dr. Corliss Skains.

## 2020-04-16 NOTE — Telephone Encounter (Signed)
Last Visit: 02/13/2020 Next Visit: 08/13/2020 Labs: 09/17/2019 Glucose is mildly elevated. Rest the labs are stable.  Called PCPs office and they will fax recent labs over.

## 2020-04-16 NOTE — Telephone Encounter (Signed)
Lab results received via fax from PCPs office.   Date: 12/06/2019  CBC, CMP, Lipid panel, albumin/creatinine ratio, A1c and TSH.   Glucose 139 A1c 7.1  Labs reviewed by Sherron Ales, PA-C. Will send to scan center.

## 2020-04-26 ENCOUNTER — Other Ambulatory Visit: Payer: Self-pay | Admitting: Rheumatology

## 2020-04-29 NOTE — Telephone Encounter (Signed)
Last Visit:02/13/2020 Next Visit:08/13/2020  Okay to refill per Dr. Deveshwar  

## 2020-05-22 ENCOUNTER — Other Ambulatory Visit: Payer: Self-pay | Admitting: Rheumatology

## 2020-05-22 NOTE — Telephone Encounter (Signed)
Last Visit: 02/13/2020 Next Visit: 08/13/2020 Labs: 12/06/2019 Glucose 139  Current Dose per office note on 02/13/2020: Celebrex 200 mg 1 capsule twice daily for pain relief  Okay to refill per Dr. Corliss Skains

## 2020-06-05 ENCOUNTER — Other Ambulatory Visit: Payer: Self-pay | Admitting: Physician Assistant

## 2020-06-27 ENCOUNTER — Other Ambulatory Visit: Payer: Self-pay | Admitting: Rheumatology

## 2020-06-27 NOTE — Telephone Encounter (Signed)
Last Visit:02/13/2020 Next Visit:08/13/2020 Labs: 12/06/2019 Glucose 139  Patient advised he is due to update labs. Patient will have done with PCP and fax to our office.   Okay to refill 30 day supply Celebrex?

## 2020-06-30 ENCOUNTER — Telehealth: Payer: Self-pay | Admitting: Rheumatology

## 2020-06-30 ENCOUNTER — Other Ambulatory Visit: Payer: Self-pay | Admitting: Rheumatology

## 2020-06-30 DIAGNOSIS — Z79899 Other long term (current) drug therapy: Secondary | ICD-10-CM

## 2020-06-30 NOTE — Telephone Encounter (Signed)
Last Visit:02/13/2020 Next Visit:08/13/2020  Okay to refill per Dr. Corliss Skains

## 2020-06-30 NOTE — Telephone Encounter (Signed)
Patient called requesting labwork orders be sent to [facility] in [place].  Patient states he plans to go next Monday, 07/07/20.

## 2020-06-30 NOTE — Telephone Encounter (Signed)
Lab Orders released.  

## 2020-07-05 ENCOUNTER — Other Ambulatory Visit: Payer: Self-pay | Admitting: Physician Assistant

## 2020-07-07 NOTE — Telephone Encounter (Signed)
Last Visit:02/13/2020 Next Visit:08/13/2020  Current Dose per office note on 02/13/2020: gabapentin 100 mg 2 tablets p.o. nightly   Okay to refill per Dr. Corliss Skains

## 2020-07-08 LAB — CBC WITH DIFFERENTIAL/PLATELET
Basophils Absolute: 0.1 10*3/uL (ref 0.0–0.2)
Basos: 1 %
EOS (ABSOLUTE): 0.1 10*3/uL (ref 0.0–0.4)
Eos: 2 %
Hematocrit: 45.9 % (ref 37.5–51.0)
Hemoglobin: 14.9 g/dL (ref 13.0–17.7)
Immature Grans (Abs): 0 10*3/uL (ref 0.0–0.1)
Immature Granulocytes: 0 %
Lymphocytes Absolute: 2.2 10*3/uL (ref 0.7–3.1)
Lymphs: 31 %
MCH: 29.4 pg (ref 26.6–33.0)
MCHC: 32.5 g/dL (ref 31.5–35.7)
MCV: 91 fL (ref 79–97)
Monocytes Absolute: 0.8 10*3/uL (ref 0.1–0.9)
Monocytes: 10 %
Neutrophils Absolute: 4 10*3/uL (ref 1.4–7.0)
Neutrophils: 56 %
Platelets: 220 10*3/uL (ref 150–450)
RBC: 5.07 x10E6/uL (ref 4.14–5.80)
RDW: 11.8 % (ref 11.6–15.4)
WBC: 7.2 10*3/uL (ref 3.4–10.8)

## 2020-07-08 LAB — CMP14+EGFR
ALT: 28 IU/L (ref 0–44)
AST: 21 IU/L (ref 0–40)
Albumin/Globulin Ratio: 2.4 — ABNORMAL HIGH (ref 1.2–2.2)
Albumin: 4.7 g/dL (ref 3.8–4.9)
Alkaline Phosphatase: 70 IU/L (ref 48–121)
BUN/Creatinine Ratio: 14 (ref 9–20)
BUN: 12 mg/dL (ref 6–24)
Bilirubin Total: 0.6 mg/dL (ref 0.0–1.2)
CO2: 20 mmol/L (ref 20–29)
Calcium: 9.6 mg/dL (ref 8.7–10.2)
Chloride: 107 mmol/L — ABNORMAL HIGH (ref 96–106)
Creatinine, Ser: 0.85 mg/dL (ref 0.76–1.27)
GFR calc Af Amer: 110 mL/min/{1.73_m2} (ref >59)
GFR calc non Af Amer: 95 mL/min/{1.73_m2} (ref >59)
Globulin, Total: 2 g/dL (ref 1.5–4.5)
Glucose: 141 mg/dL — ABNORMAL HIGH (ref 65–99)
Potassium: 4.2 mmol/L (ref 3.5–5.2)
Sodium: 141 mmol/L (ref 134–144)
Total Protein: 6.7 g/dL (ref 6.0–8.5)

## 2020-07-08 NOTE — Telephone Encounter (Signed)
Labs are stable.

## 2020-07-27 ENCOUNTER — Other Ambulatory Visit: Payer: Self-pay | Admitting: Physician Assistant

## 2020-07-28 NOTE — Telephone Encounter (Signed)
Last Visit:02/13/2020 Next Visit:08/13/2020  Okay to refill Cyclobenzaprine?

## 2020-07-28 NOTE — Telephone Encounter (Signed)
Patient states the 10 mg works out for him. Patient states he does not take it daily. Patient states that sometimes he only takes a quarter of a tablet. Patient states sometimes he has to take a half or a whole depending on his activity level that day. Patient states sometimes this is the only thing that keeps him walking. Please advise.

## 2020-07-28 NOTE — Telephone Encounter (Signed)
Ok to refill the rx.

## 2020-07-28 NOTE — Telephone Encounter (Signed)
Please advise patient if possible to reduce cyclobenzaprine to 5 mg p.o. daily.

## 2020-08-01 NOTE — Progress Notes (Signed)
Office Visit Note  Patient: [name] [name]             Date of Birth: Apr 30, 1961           MRN: [id number]             PCP: Carylon Perches, MD Referring: Carylon Perches, MD Visit Date: 08/13/2020 Occupation: @GUAROCC @  Subjective:  Other (sciatic nerve pain, bilateral hand pain )   History of Present Illness: [name] [name] is a 59 y.o. male with history of osteoarthritis and degenerative disc disease.  He states he continues to have a lot of pain and discomfort in his hands.  He has been very active and working in his yard.  He states his been lifting heavy objects.  Has been having some lower back pain radiating to his left lower extremity.  He has been having some discomfort in his knee joints which is tolerable.  He states he takes Celebrex to manage pain.  He states the Flexeril really helps him to relax his muscles and sleep at night as he has a lot of discomfort.  He still has been taking gabapentin at bedtime.  Activities of Daily Living:  Patient reports morning stiffness for several  hours.   Patient Denies nocturnal pain.  Difficulty dressing/grooming: Denies Difficulty climbing stairs: Reports Difficulty getting out of chair: Reports Difficulty using hands for taps, buttons, cutlery, and/or writing: Denies  Review of Systems  Constitutional: Negative for fatigue.  HENT: Positive for mouth dryness. Negative for mouth sores and nose dryness.   Eyes: Positive for dryness. Negative for pain and itching.  Respiratory: Negative for shortness of breath and difficulty breathing.   Cardiovascular: Negative for chest pain and palpitations.  Gastrointestinal: Negative for blood in stool, constipation and diarrhea.  Endocrine: Negative for increased urination.  Genitourinary: Negative for difficulty urinating.  Musculoskeletal: Positive for arthralgias, joint pain and morning stiffness. Negative for joint swelling, myalgias, muscle tenderness and myalgias.  Skin: Negative for color  change, rash and redness.  Allergic/Immunologic: Negative for susceptible to infections.  Neurological: Negative for dizziness, numbness, headaches, memory loss and weakness.  Hematological: Positive for bruising/bleeding tendency.  Psychiatric/Behavioral: Negative for confusion.    PMFS History:  Patient Active Problem List   Diagnosis Date Noted  . History of environmental allergies 12/28/2016  . Type 2 diabetes mellitus without complication, without long-term current use of insulin (HCC) 12/28/2016  . Primary osteoarthritis of both hands 12/25/2016  . Gastroesophageal reflux disease without esophagitis 12/25/2016  . Neuralgia 12/25/2016  . Primary osteoarthritis of both knees 12/24/2016  . Spondylosis of lumbar region without myelopathy or radiculopathy 12/24/2016  . DJD (degenerative joint disease), cervical 12/24/2016  . Obesity 10/08/2013  . Arthritis of both knees 10/08/2013  . LBP (low back pain) 10/08/2013  . HTN (hypertension) 10/08/2013  . Hypothyroidism 10/08/2013  . Constipation 10/04/2012    Past Medical History:  Diagnosis Date  . Arthritis   . Chronic constipation   . Constipation   . Hypertension   . Hyperthyroidism     Family History  Problem Relation Age of Onset  . Lung disease Sister    Past Surgical History:  Procedure Laterality Date  . ABDOMINAL HERNIA REPAIR     Patient states that it was a double hernia surgery  . FOOT SURGERY    . HIATAL HERNIA REPAIR    . knee surgeries    . neck surgeries    . UPPER GASTROINTESTINAL ENDOSCOPY  1980's   Social  History   Social History Narrative  . Not on file   Immunization History  Administered Date(s) Administered  . Moderna SARS-COVID-2 Vaccination 02/04/2020, 03/04/2020     Objective: Vital Signs: BP (!) 141/89 (BP Location: Left Arm, Patient Position: Sitting, Cuff Size: Normal)   Pulse 70   Resp 16   Ht 6\' 3"  (1.905 m)   Wt 285 lb 12.8 oz (129.6 kg)   BMI 35.72 kg/m    Physical  Exam Vitals and nursing note reviewed.  Constitutional:      Appearance: He is well-developed.  HENT:     Head: Normocephalic and atraumatic.  Eyes:     Conjunctiva/sclera: Conjunctivae normal.     Pupils: Pupils are equal, round, and reactive to light.  Cardiovascular:     Rate and Rhythm: Normal rate and regular rhythm.     Heart sounds: Normal heart sounds.  Pulmonary:     Effort: Pulmonary effort is normal.     Breath sounds: Normal breath sounds.  Abdominal:     General: Bowel sounds are normal.     Palpations: Abdomen is soft.     Hernia: A hernia is present.  Musculoskeletal:     Cervical back: Normal range of motion and neck supple.  Skin:    General: Skin is warm and dry.     Capillary Refill: Capillary refill takes less than 2 seconds.  Neurological:     Mental Status: He is alert and oriented to person, place, and time.  Psychiatric:        Behavior: Behavior normal.      Musculoskeletal Exam: C-spine was in good range of motion.  He had some discomfort in the lumbar region.  Shoulder joints, elbow joints, wrist joints with good range of motion.  He has bilateral PIP and DIP thickening.  Hip joints, knee joints with good range of motion.  He has PIP or DIP thickening.  CDAI Exam: CDAI Score: -- Patient Global: --; Provider Global: -- Swollen: --; Tender: -- Joint Exam 08/13/2020   No joint exam has been documented for this visit   There is currently no information documented on the homunculus. Go to the Rheumatology activity and complete the homunculus joint exam.  Investigation: No additional findings.  Imaging: No results found.  Recent Labs: Lab Results  Component Value Date   WBC 7.2 07/07/2020   HGB 14.9 07/07/2020   PLT 220 07/07/2020   NA 141 07/07/2020   K 4.2 07/07/2020   CL 107 (H) 07/07/2020   CO2 20 07/07/2020   GLUCOSE 141 (H) 07/07/2020   BUN 12 07/07/2020   CREATININE 0.85 07/07/2020   BILITOT 0.6 07/07/2020   ALKPHOS 70  07/07/2020   AST 21 07/07/2020   ALT 28 07/07/2020   PROT 6.7 07/07/2020   ALBUMIN 4.7 07/07/2020   CALCIUM 9.6 07/07/2020   GFRAA 110 07/07/2020    Speciality Comments: No specialty comments available.  Procedures:  No procedures performed Allergies: Daypro [oxaprozin] and Morphine and related   Assessment / Plan:     Visit Diagnoses: Primary osteoarthritis of both hands-he has bilateral PIP and DIP thickening.  Joint protection was discussed.  His pain is manageable with current combination of medications.  He takes Celebrex on a regular basis.  His labs have been stable.  Side effects of NSAID were discussed.  Osteoarthritis of both knees-he has discomfort in his both knees.  No warmth swelling or effusion was noted.  DDD (degenerative disc disease), cervical-he has limited  range of motion without much discomfort.  DDD (degenerative disc disease), lumbar -he complains of chronic lower back pain with left-sided radiculopathy.  He has been on following medications.  Gabapentin 100 mg 2 tablets p.o. nightly and celebrex 200 mg 1 to 2 tablets as needed.   Neuralgia-he continues to have left-sided radiculopathy.  Core muscle strengthening exercises and weight loss was emphasized.  History of environmental allergies  History of gastroesophageal reflux (GERD)  History of hypothyroidism  Essential hypertension  Type 2 diabetes mellitus without complication, without long-term current use of insulin (HCC)  Educated about COVID-19 virus infection-he has been fully vaccinated points to COVID-19.  I have advised him to get a booster when is available to him.  Use of mask, social distancing and hand hygiene was discussed.  Orders: No orders of the defined types were placed in this encounter.  Meds ordered this encounter  Medications  . cyclobenzaprine (FLEXERIL) 5 MG tablet    Sig: Take 1 tablet (5 mg total) by mouth daily as needed for muscle spasms.    Dispense:  30 tablet     Refill:  0     Follow-Up Instructions: Return in about 6 months (around 02/10/2021) for Osteoarthritis.   Pollyann Savoy, MD  Note - This record has been created using Animal nutritionist.  Chart creation errors have been sought, but may not always  have been located. Such creation errors do not reflect on  the standard of medical care.

## 2020-08-08 ENCOUNTER — Other Ambulatory Visit: Payer: Self-pay | Admitting: Rheumatology

## 2020-08-08 NOTE — Telephone Encounter (Signed)
Last Visit:02/13/2020 Next Visit:08/13/2020 Labs: 07/07/2020 stable  Current Dose per office note on 02/13/2020: gabapentin 100 mg 2 tablets p.o. nightly  Okay to refill per Dr. Corliss Skains

## 2020-08-13 ENCOUNTER — Other Ambulatory Visit: Payer: Self-pay

## 2020-08-13 ENCOUNTER — Encounter: Payer: Self-pay | Admitting: Rheumatology

## 2020-08-13 ENCOUNTER — Ambulatory Visit (INDEPENDENT_AMBULATORY_CARE_PROVIDER_SITE_OTHER): Payer: Worker's Compensation | Admitting: Rheumatology

## 2020-08-13 VITALS — BP 141/89 | HR 70 | Resp 16 | Ht 75.0 in | Wt 285.8 lb

## 2020-08-13 DIAGNOSIS — Z8719 Personal history of other diseases of the digestive system: Secondary | ICD-10-CM

## 2020-08-13 DIAGNOSIS — M5030 Other cervical disc degeneration, unspecified cervical region: Secondary | ICD-10-CM

## 2020-08-13 DIAGNOSIS — E119 Type 2 diabetes mellitus without complications: Secondary | ICD-10-CM

## 2020-08-13 DIAGNOSIS — M19041 Primary osteoarthritis, right hand: Secondary | ICD-10-CM | POA: Diagnosis not present

## 2020-08-13 DIAGNOSIS — M5136 Other intervertebral disc degeneration, lumbar region: Secondary | ICD-10-CM | POA: Diagnosis not present

## 2020-08-13 DIAGNOSIS — Z9109 Other allergy status, other than to drugs and biological substances: Secondary | ICD-10-CM

## 2020-08-13 DIAGNOSIS — I10 Essential (primary) hypertension: Secondary | ICD-10-CM

## 2020-08-13 DIAGNOSIS — M19042 Primary osteoarthritis, left hand: Secondary | ICD-10-CM

## 2020-08-13 DIAGNOSIS — M51369 Other intervertebral disc degeneration, lumbar region without mention of lumbar back pain or lower extremity pain: Secondary | ICD-10-CM

## 2020-08-13 DIAGNOSIS — M792 Neuralgia and neuritis, unspecified: Secondary | ICD-10-CM

## 2020-08-13 DIAGNOSIS — Z8639 Personal history of other endocrine, nutritional and metabolic disease: Secondary | ICD-10-CM

## 2020-08-13 DIAGNOSIS — M170 Bilateral primary osteoarthritis of knee: Secondary | ICD-10-CM | POA: Diagnosis not present

## 2020-08-13 DIAGNOSIS — Z7189 Other specified counseling: Secondary | ICD-10-CM

## 2020-08-13 MED ORDER — CYCLOBENZAPRINE HCL 5 MG PO TABS: 5.0000 mg | ORAL_TABLET | Freq: Every day | ORAL | 0 refills | Status: DC | PRN

## 2020-09-03 ENCOUNTER — Other Ambulatory Visit: Payer: Self-pay | Admitting: Rheumatology

## 2020-09-03 NOTE — Telephone Encounter (Signed)
Last Visit: 08/13/2020 Next Visit: 02/11/2021  Okay to refill per Dr. Deveshwar  

## 2020-09-04 ENCOUNTER — Other Ambulatory Visit: Payer: Self-pay | Admitting: Rheumatology

## 2020-09-04 NOTE — Telephone Encounter (Signed)
Last Visit: 08/13/2020 Next visit: 02/11/2021 Labs: 07/07/2020 Labs are stable.  Okay to refill per Dr. Corliss Skains

## 2020-10-04 ENCOUNTER — Other Ambulatory Visit: Payer: Self-pay | Admitting: Rheumatology

## 2020-10-06 NOTE — Telephone Encounter (Signed)
Last Visit: 08/13/2020 Next Visit: 02/11/2021  Okay to refill per Dr. Corliss Skains

## 2020-10-14 ENCOUNTER — Other Ambulatory Visit: Payer: Self-pay | Admitting: Rheumatology

## 2020-11-01 ENCOUNTER — Other Ambulatory Visit: Payer: Self-pay | Admitting: Rheumatology

## 2020-11-15 ENCOUNTER — Other Ambulatory Visit: Payer: Self-pay | Admitting: Rheumatology

## 2020-11-16 NOTE — Telephone Encounter (Signed)
Pt is on Celebrex 200 mg po BID. He can not use Voltaren gel due to increased risk on toxicity. I will refuse the rx.

## 2020-11-17 ENCOUNTER — Other Ambulatory Visit: Payer: Self-pay | Admitting: Rheumatology

## 2020-11-19 ENCOUNTER — Telehealth: Payer: Self-pay | Admitting: *Deleted

## 2020-11-19 NOTE — Telephone Encounter (Signed)
Patient called because his prescription for Voltaren Gel was declined. Per refill request note on 11/15/2020: Pt is on Celebrex 200 mg po BID. He can not use Voltaren gel due to increased risk on toxicity. Patient states he has been on the two medications together for years. Patient states he needs to Voltaren Gel to help him get through the day. Please advise.

## 2020-11-19 NOTE — Telephone Encounter (Signed)
I called patient and discussed that as he is taking Celebrex twice a day on a regular basis he should not be using topical Voltaren gel.  He voiced understanding.

## 2020-11-25 ENCOUNTER — Other Ambulatory Visit: Payer: Self-pay | Admitting: Physician Assistant

## 2020-11-25 NOTE — Telephone Encounter (Signed)
Cyclobenzaprine was reduced to 5 mg p.o. nightly at the last visit.  I will send the reduced dose.

## 2020-11-25 NOTE — Telephone Encounter (Signed)
Last Visit: 08/13/2020 Next Visit: 02/11/2021  Current Dose per office note on 08/13/2020: dose not specified. "He states the Flexeril really helps him to relax his muscles and sleep at night as he has a lot of discomfort" Last fill: 08/13/2020   Okay to refill flexeril?

## 2020-12-06 ENCOUNTER — Other Ambulatory Visit: Payer: Self-pay | Admitting: Rheumatology

## 2020-12-08 NOTE — Telephone Encounter (Signed)
Last Visit: 08/13/2020 Next Visit: 02/11/2021  Current Dose per office note on 08/13/2020,  Gabapentin 100 mg 2 tablets p.o. nightly  Dx: DDD (degenerative disc disease),   Okay to refill Gabapentin?   

## 2020-12-13 ENCOUNTER — Other Ambulatory Visit: Payer: PRIVATE HEALTH INSURANCE

## 2020-12-13 DIAGNOSIS — Z20822 Contact with and (suspected) exposure to covid-19: Secondary | ICD-10-CM

## 2020-12-16 LAB — NOVEL CORONAVIRUS, NAA: SARS-CoV-2, NAA: NOT DETECTED

## 2021-01-03 ENCOUNTER — Other Ambulatory Visit: Payer: Self-pay | Admitting: Physician Assistant

## 2021-01-05 NOTE — Telephone Encounter (Signed)
Last Visit: 08/13/2020 Next Visit: 02/11/2021  Current Dose per office note on 08/13/2020,  Gabapentin 100 mg 2 tablets p.o. nightly  Dx: DDD (degenerative disc disease),   Okay to refill Gabapentin?

## 2021-01-26 ENCOUNTER — Other Ambulatory Visit: Payer: Self-pay | Admitting: Rheumatology

## 2021-01-26 DIAGNOSIS — Z79899 Other long term (current) drug therapy: Secondary | ICD-10-CM

## 2021-01-27 ENCOUNTER — Other Ambulatory Visit: Payer: Self-pay | Admitting: *Deleted

## 2021-01-27 DIAGNOSIS — Z79899 Other long term (current) drug therapy: Secondary | ICD-10-CM

## 2021-01-27 DIAGNOSIS — M19041 Primary osteoarthritis, right hand: Secondary | ICD-10-CM

## 2021-01-27 NOTE — Telephone Encounter (Signed)
Last Visit: 08/13/2020 Next Visit: 02/11/2021 Labs: 07/07/2020, Labs are stable. [facility] labs are due.  Current Dose per office note 08/13/2020, celebrex 200 mg 1 to 2 tablets as needed.  DX:  DDD (degenerative disc disease), lumbar  Last Fill: 10/14/2020  Okay to refill Celebrex?

## 2021-01-28 NOTE — Progress Notes (Deleted)
Office Visit Note  Patient: [name] [name]             Date of Birth: 1961-07-10           MRN: [id number]             PCP: Carylon Perches, MD Referring: Carylon Perches, MD Visit Date: 02/11/2021 Occupation: @GUAROCC @  Subjective:  No chief complaint on file.   History of Present Illness: [name] [name] is a 60 y.o. male ***   Activities of Daily Living:  Patient reports morning stiffness for *** {minute/hour:19697}.   Patient {ACTIONS;DENIES/REPORTS:21021675::"Denies"} nocturnal pain.  Difficulty dressing/grooming: {ACTIONS;DENIES/REPORTS:21021675::"Denies"} Difficulty climbing stairs: {ACTIONS;DENIES/REPORTS:21021675::"Denies"} Difficulty getting out of chair: {ACTIONS;DENIES/REPORTS:21021675::"Denies"} Difficulty using hands for taps, buttons, cutlery, and/or writing: {ACTIONS;DENIES/REPORTS:21021675::"Denies"}  No Rheumatology ROS completed.   PMFS History:  Patient Active Problem List   Diagnosis Date Noted  . History of environmental allergies 12/28/2016  . Type 2 diabetes mellitus without complication, without long-term current use of insulin (HCC) 12/28/2016  . Primary osteoarthritis of both hands 12/25/2016  . Gastroesophageal reflux disease without esophagitis 12/25/2016  . Neuralgia 12/25/2016  . Primary osteoarthritis of both knees 12/24/2016  . Spondylosis of lumbar region without myelopathy or radiculopathy 12/24/2016  . DJD (degenerative joint disease), cervical 12/24/2016  . Obesity 10/08/2013  . Arthritis of both knees 10/08/2013  . LBP (low back pain) 10/08/2013  . HTN (hypertension) 10/08/2013  . Hypothyroidism 10/08/2013  . Constipation 10/04/2012    Past Medical History:  Diagnosis Date  . Arthritis   . Chronic constipation   . Constipation   . Hypertension   . Hyperthyroidism     Family History  Problem Relation Age of Onset  . Lung disease Sister    Past Surgical History:  Procedure Laterality Date  . ABDOMINAL HERNIA REPAIR     Patient  states that it was a double hernia surgery  . FOOT SURGERY    . HIATAL HERNIA REPAIR    . knee surgeries    . neck surgeries    . UPPER GASTROINTESTINAL ENDOSCOPY  1980's   Social History   Social History Narrative  . Not on file   Immunization History  Administered Date(s) Administered  . Moderna Sars-Covid-2 Vaccination 02/04/2020, 03/04/2020     Objective: Vital Signs: There were no vitals taken for this visit.   Physical Exam   Musculoskeletal Exam: ***  CDAI Exam: CDAI Score: -- Patient Global: --; Provider Global: -- Swollen: --; Tender: -- Joint Exam 02/11/2021   No joint exam has been documented for this visit   There is currently no information documented on the homunculus. Go to the Rheumatology activity and complete the homunculus joint exam.  Investigation: No additional findings.  Imaging: No results found.  Recent Labs: Lab Results  Component Value Date   WBC 7.2 07/07/2020   HGB 14.9 07/07/2020   PLT 220 07/07/2020   NA 141 07/07/2020   K 4.2 07/07/2020   CL 107 (H) 07/07/2020   CO2 20 07/07/2020   GLUCOSE 141 (H) 07/07/2020   BUN 12 07/07/2020   CREATININE 0.85 07/07/2020   BILITOT 0.6 07/07/2020   ALKPHOS 70 07/07/2020   AST 21 07/07/2020   ALT 28 07/07/2020   PROT 6.7 07/07/2020   ALBUMIN 4.7 07/07/2020   CALCIUM 9.6 07/07/2020   GFRAA 110 07/07/2020    Speciality Comments: No specialty comments available.  Procedures:  No procedures performed Allergies: Daypro [oxaprozin] and Morphine and related   Assessment / Plan:  Visit Diagnoses: No diagnosis found.  Orders: No orders of the defined types were placed in this encounter.  No orders of the defined types were placed in this encounter.   Face-to-face time spent with patient was *** minutes. Greater than 50% of time was spent in counseling and coordination of care.  Follow-Up Instructions: No follow-ups on file.   Earnestine Mealing, CMA  Note - This record has  been created using Editor, commissioning.  Chart creation errors have been sought, but may not always  have been located. Such creation errors do not reflect on  the standard of medical care.

## 2021-02-04 ENCOUNTER — Other Ambulatory Visit: Payer: Self-pay | Admitting: Rheumatology

## 2021-02-04 NOTE — Telephone Encounter (Signed)
Last Visit: 08/13/2020 Next Visit: 02/11/2021  Current Dose per office note on 08/13/2020,  cyclobenzaprine (FLEXERIL) 5 MG tablet     Sig: Take 1 tablet (5 mg total) by mouth daily as needed for muscle spasms.    Dispense:  30 tablet    Refill:  0    Dx:  Primary osteoarthritis of both hands  Last Fill:11/25/2020  Okay to refill Flexeril?

## 2021-02-11 ENCOUNTER — Ambulatory Visit: Payer: PRIVATE HEALTH INSURANCE | Admitting: Rheumatology

## 2021-02-11 DIAGNOSIS — E119 Type 2 diabetes mellitus without complications: Secondary | ICD-10-CM

## 2021-02-11 DIAGNOSIS — M5030 Other cervical disc degeneration, unspecified cervical region: Secondary | ICD-10-CM

## 2021-02-11 DIAGNOSIS — I10 Essential (primary) hypertension: Secondary | ICD-10-CM

## 2021-02-11 DIAGNOSIS — Z8639 Personal history of other endocrine, nutritional and metabolic disease: Secondary | ICD-10-CM

## 2021-02-11 DIAGNOSIS — M5136 Other intervertebral disc degeneration, lumbar region: Secondary | ICD-10-CM

## 2021-02-11 DIAGNOSIS — M19041 Primary osteoarthritis, right hand: Secondary | ICD-10-CM

## 2021-02-11 DIAGNOSIS — M170 Bilateral primary osteoarthritis of knee: Secondary | ICD-10-CM

## 2021-02-11 DIAGNOSIS — Z9109 Other allergy status, other than to drugs and biological substances: Secondary | ICD-10-CM

## 2021-02-11 DIAGNOSIS — Z8719 Personal history of other diseases of the digestive system: Secondary | ICD-10-CM

## 2021-02-11 DIAGNOSIS — M792 Neuralgia and neuritis, unspecified: Secondary | ICD-10-CM

## 2021-02-13 NOTE — Progress Notes (Signed)
Office Visit Note  Patient: [name] [name]             Date of Birth: 1961/11/11           MRN: [id number]             PCP: Asencion Noble, MD Referring: Asencion Noble, MD Visit Date: 02/17/2021 Occupation: @GUAROCC @  Subjective:  Pain in multiple joints   History of Present Illness: [name] [name] is a 60 y.o. male with history of osteoarthritis and DDD.  He continues to have chronic pain in multiple joints including his lower back, both hands, and both knees.  He says that his lower back pain is aggravated by strenuous activities.  He continues to work a physically demanding job which exacerbates his symptoms.  He has been sleeping in a recliner to help alleviate his lower back pain.  He is not ready to proceed with lower back surgery at this time.  He has been performing back exercises on a daily basis.  He takes Flexeril 5 mg at bedtime as needed for muscle spasms.  He states he continues to experience stiffness in his neck status post cervical fusion previously.  He has been wearing a right knee brace for support and his discomfort is more severe.  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  He denies any joint swelling.  He continues to take Celebrex 200 mg 1 capsule by mouth twice daily for pain relief.  He continues to find Celebrex to be effective at managing his discomfort. He states that he will be having lab work drawn at [facility] on 03/11/2021.     Activities of Daily Living:  Patient reports morning stiffness for all day. Patient Denies nocturnal pain.  Difficulty dressing/grooming: Denies Difficulty climbing stairs: Reports Difficulty getting out of chair: Denies Difficulty using hands for taps, buttons, cutlery, and/or writing: Reports  Review of Systems  Constitutional: Negative for fatigue.  HENT: Negative for mouth sores, mouth dryness and nose dryness.   Eyes: Positive for dryness.  Negative for pain and itching.  Respiratory: Negative for shortness of breath and difficulty breathing.   Cardiovascular: Negative for chest pain and palpitations.  Gastrointestinal: Negative for blood in stool, constipation and diarrhea.  Endocrine: Negative for increased urination.  Genitourinary: Negative for difficulty urinating.  Musculoskeletal: Positive for arthralgias, joint pain, joint swelling and morning stiffness. Negative for myalgias, muscle tenderness and myalgias.  Skin: Negative for color change, rash and redness.  Allergic/Immunologic: Negative for susceptible to infections.  Neurological: Negative for dizziness, numbness, headaches, memory loss and weakness.  Hematological: Positive for bruising/bleeding tendency.  Psychiatric/Behavioral: Negative for confusion.    PMFS History:  Patient Active Problem List   Diagnosis Date Noted  . History of environmental allergies 12/28/2016  . Type 2 diabetes mellitus without complication, without long-term current use of insulin ([place]) 12/28/2016  . Primary osteoarthritis of both hands 12/25/2016  . Gastroesophageal reflux disease without esophagitis 12/25/2016  . Neuralgia 12/25/2016  . Primary osteoarthritis of both knees 12/24/2016  . Spondylosis of lumbar region without myelopathy or radiculopathy 12/24/2016  . DJD (degenerative joint disease), cervical 12/24/2016  . Obesity 10/08/2013  . Arthritis of both knees 10/08/2013  . LBP (low back pain) 10/08/2013  . HTN (hypertension) 10/08/2013  . Hypothyroidism 10/08/2013  . Constipation 10/04/2012    Past Medical History:  Diagnosis Date  . Arthritis   . Chronic constipation   .  Constipation   . Hypertension   . Hyperthyroidism     Family History  Problem Relation Age of Onset  . Lung disease Sister    Past Surgical History:  Procedure Laterality Date  . ABDOMINAL HERNIA REPAIR     Patient states that it was a double hernia surgery  . FOOT SURGERY    . HIATAL  HERNIA REPAIR    . knee surgeries    . neck surgeries    . UPPER GASTROINTESTINAL ENDOSCOPY  1980's   Social History   Social History Narrative  . Not on file   Immunization History  Administered Date(s) Administered  . Moderna Sars-Covid-2 Vaccination 02/04/2020, 03/04/2020, 11/11/2020     Objective: Vital Signs: BP (!) 146/85 (BP Location: Right Arm, Patient Position: Sitting, Cuff Size: Normal)   Pulse 93   Resp 16   Ht 6' 3"  (1.905 m)   Wt 285 lb 3.2 oz (129.4 kg)   BMI 35.65 kg/m    Physical Exam Vitals and nursing note reviewed.  Constitutional:      Appearance: He is well-developed.  HENT:     Head: Normocephalic and atraumatic.  Eyes:     Conjunctiva/sclera: Conjunctivae normal.     Pupils: Pupils are equal, round, and reactive to light.  Pulmonary:     Effort: Pulmonary effort is normal.  Abdominal:     Palpations: Abdomen is soft.  Musculoskeletal:     Cervical back: Normal range of motion and neck supple.  Skin:    General: Skin is warm and dry.     Capillary Refill: Capillary refill takes less than 2 seconds.  Neurological:     Mental Status: He is alert and oriented to person, place, and time.  Psychiatric:        Behavior: Behavior normal.      Musculoskeletal Exam: C-spine limited ROM.  Painful and limited ROM of lumbar spine.  Midline spinal tenderness in the lumbar region.  Paraspinal muscle tenderness in the lumbar region noted.  Shoulder joints, elbow joints, wrist joints, MCPs, PIPs, DIPs have good range of motion with no synovitis.  He has PIP and DIP thickening consistent with osteoarthritis of both hands.  He is able to make a complete fist bilaterally.  He has Mesick joint tenderness bilaterally.  Knee joints have good range of motion with no warmth or effusion.  Bilateral knee crepitus noted.  Ankle joints have good range of motion with no discomfort.  CDAI Exam: CDAI Score: -- Patient Global: --; Provider Global: -- Swollen: --; Tender:  -- Joint Exam 02/17/2021   No joint exam has been documented for this visit   There is currently no information documented on the homunculus. Go to the Rheumatology activity and complete the homunculus joint exam.  Investigation: No additional findings.  Imaging: No results found.  Recent Labs: Lab Results  Component Value Date   WBC 7.2 07/07/2020   HGB 14.9 07/07/2020   PLT 220 07/07/2020   NA 141 07/07/2020   K 4.2 07/07/2020   CL 107 (H) 07/07/2020   CO2 20 07/07/2020   GLUCOSE 141 (H) 07/07/2020   BUN 12 07/07/2020   CREATININE 0.85 07/07/2020   BILITOT 0.6 07/07/2020   ALKPHOS 70 07/07/2020   AST 21 07/07/2020   ALT 28 07/07/2020   PROT 6.7 07/07/2020   ALBUMIN 4.7 07/07/2020   CALCIUM 9.6 07/07/2020   GFRAA 110 07/07/2020    Speciality Comments: No specialty comments available.  Procedures:  No procedures performed  Allergies: Daypro [oxaprozin] and Morphine and related   Assessment / Plan:     Visit Diagnoses: Primary osteoarthritis of both hands - He has PIP and DIP thickening consistent with osteoarthritis of both hands.  CMC joint prominence and tenderness noted bilaterally.  No synovitis was noted.  He is able to make a complete fist bilaterally.  We discussed the use of arthritis compression gloves.  He can continue to use Voltaren gel topically as needed for pain relief.  He was advised to notify us if he develops increased joint pain or joint swelling.  He will follow-up in the office in 6 months.    Primary osteoarthritis of both knees: Chronic pain.  He continues to have persistent discomfort in both knees which is typically exacerbated by strenuous activities.  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  He has not noticed any joint swelling.  He has been wearing a right knee joint brace for support which alleviate some of his discomfort.  On examination today he has  good range of motion of both knee joints with no warmth or effusion.  Bilateral knee crepitus was noted.  He has been taking Celebrex 200 mg 1 capsule by mouth twice daily for pain relief.  He continues to find Celebrex to be effective at managing his discomfort.  DDD (degenerative disc disease), cervical: Status post fusion.  He has limited range of motion with lateral rotation.  No symptoms of radiculopathy currently.  DDD (degenerative disc disease), lumbar: Chronic pain.  He continues to experience bandlike discomfort in the lumbar region.  He has intermittent symptoms of right-sided sciatica.  His discomfort is typically exacerbated by strenuous activities.  He takes Flexeril 5 mg by mouth at bedtime as needed for muscle spasms and gabapentin 100 mg 2 capsules at bedtime.  We discussed a referral to physical therapy but he declined at this time.  We also discussed a pain management referral which she declined.  He was encouraged to try water exercise once he renews his gym membership.  He was advised to notify us if his discomfort persists or worsens.  Medication monitoring encounter - Plan: CMP14+EGFR, CBC with Differential/Platelet  Neuralgia: His symptoms have been well managed on gabapentin 100 mg 2 capsules by mouth at bedtime.  Other medical conditions are listed as follows  History of environmental allergies  History of gastroesophageal reflux (GERD)  History of hypothyroidism  Essential hypertension  Type 2 diabetes mellitus without complication, without long-term current use of insulin ([place])  Orders: Orders Placed This Encounter  Procedures  . CMP14+EGFR  . CBC with Differential/Platelet   No orders of the defined types were placed in this encounter.   Follow-Up Instructions: Return in about 6 months (around 08/20/2021) for Osteoarthritis, DDD.   Ofilia Neas, PA-C  Note - This record has been created using Dragon software.  Chart creation errors have been sought,  but may not always  have been located. Such creation errors do not reflect on  the standard of medical care.

## 2021-02-17 ENCOUNTER — Ambulatory Visit (INDEPENDENT_AMBULATORY_CARE_PROVIDER_SITE_OTHER): Payer: Worker's Compensation | Admitting: Physician Assistant

## 2021-02-17 ENCOUNTER — Other Ambulatory Visit: Payer: Self-pay

## 2021-02-17 ENCOUNTER — Encounter: Payer: Self-pay | Admitting: Physician Assistant

## 2021-02-17 VITALS — BP 146/85 | HR 93 | Resp 16 | Ht 75.0 in | Wt 285.2 lb

## 2021-02-17 DIAGNOSIS — I10 Essential (primary) hypertension: Secondary | ICD-10-CM

## 2021-02-17 DIAGNOSIS — M792 Neuralgia and neuritis, unspecified: Secondary | ICD-10-CM

## 2021-02-17 DIAGNOSIS — Z8639 Personal history of other endocrine, nutritional and metabolic disease: Secondary | ICD-10-CM

## 2021-02-17 DIAGNOSIS — M19042 Primary osteoarthritis, left hand: Secondary | ICD-10-CM

## 2021-02-17 DIAGNOSIS — M19041 Primary osteoarthritis, right hand: Secondary | ICD-10-CM | POA: Diagnosis not present

## 2021-02-17 DIAGNOSIS — M170 Bilateral primary osteoarthritis of knee: Secondary | ICD-10-CM

## 2021-02-17 DIAGNOSIS — E119 Type 2 diabetes mellitus without complications: Secondary | ICD-10-CM

## 2021-02-17 DIAGNOSIS — Z9109 Other allergy status, other than to drugs and biological substances: Secondary | ICD-10-CM

## 2021-02-17 DIAGNOSIS — M5136 Other intervertebral disc degeneration, lumbar region: Secondary | ICD-10-CM | POA: Diagnosis not present

## 2021-02-17 DIAGNOSIS — Z8719 Personal history of other diseases of the digestive system: Secondary | ICD-10-CM

## 2021-02-17 DIAGNOSIS — M5030 Other cervical disc degeneration, unspecified cervical region: Secondary | ICD-10-CM

## 2021-02-17 DIAGNOSIS — Z5181 Encounter for therapeutic drug level monitoring: Secondary | ICD-10-CM

## 2021-03-02 ENCOUNTER — Other Ambulatory Visit: Payer: Self-pay | Admitting: Rheumatology

## 2021-03-02 DIAGNOSIS — Z79899 Other long term (current) drug therapy: Secondary | ICD-10-CM

## 2021-03-02 NOTE — Telephone Encounter (Signed)
Next Visit: 08/18/2021  Last Visit: 02/17/2021  Last Fill: 01/27/2021  DX:  Primary osteoarthritis of both knees  Current Dose per office note 02/17/2021, Celebrex 200 mg 1 capsule by mouth twice daily for pain relief  Labs:  07/07/2020, Labs are stable. 02/17/2021, He states that he will be having lab work drawn at [facility] on 03/11/2021.    Okay to refill Celebrex?

## 2021-03-16 ENCOUNTER — Telehealth: Payer: Self-pay

## 2021-03-16 NOTE — Telephone Encounter (Signed)
Release labs on Wednesday, patient is having labs drawn on Thursday.

## 2021-03-16 NOTE — Telephone Encounter (Signed)
Patient called requesting his labwork orders be sent to [facility] in [place].  Patient states he plans to go this week.

## 2021-03-17 ENCOUNTER — Other Ambulatory Visit: Payer: Self-pay | Admitting: *Deleted

## 2021-03-17 DIAGNOSIS — Z5181 Encounter for therapeutic drug level monitoring: Secondary | ICD-10-CM

## 2021-03-17 NOTE — Telephone Encounter (Signed)
Released.

## 2021-03-20 LAB — CBC WITH DIFFERENTIAL/PLATELET
Basophils Absolute: 0.1 10*3/uL (ref 0.0–0.2)
Basos: 1 %
EOS (ABSOLUTE): 0.1 10*3/uL (ref 0.0–0.4)
Eos: 1 %
Hematocrit: 45.2 % (ref 37.5–51.0)
Hemoglobin: 15.3 g/dL (ref 13.0–17.7)
Immature Grans (Abs): 0 10*3/uL (ref 0.0–0.1)
Immature Granulocytes: 0 %
Lymphocytes Absolute: 2 10*3/uL (ref 0.7–3.1)
Lymphs: 27 %
MCH: 29.7 pg (ref 26.6–33.0)
MCHC: 33.8 g/dL (ref 31.5–35.7)
MCV: 88 fL (ref 79–97)
Monocytes Absolute: 0.6 10*3/uL (ref 0.1–0.9)
Monocytes: 9 %
Neutrophils Absolute: 4.5 10*3/uL (ref 1.4–7.0)
Neutrophils: 62 %
Platelets: 233 10*3/uL (ref 150–450)
RBC: 5.15 x10E6/uL (ref 4.14–5.80)
RDW: 11.9 % (ref 11.6–15.4)
WBC: 7.3 10*3/uL (ref 3.4–10.8)

## 2021-03-20 LAB — CMP14+EGFR
ALT: 34 IU/L (ref 0–44)
AST: 31 IU/L (ref 0–40)
Albumin/Globulin Ratio: 2.2 (ref 1.2–2.2)
Albumin: 4.9 g/dL (ref 3.8–4.9)
Alkaline Phosphatase: 72 IU/L (ref 44–121)
BUN/Creatinine Ratio: 17 (ref 10–24)
BUN: 15 mg/dL (ref 8–27)
Bilirubin Total: 0.7 mg/dL (ref 0.0–1.2)
CO2: 19 mmol/L — ABNORMAL LOW (ref 20–29)
Calcium: 9.7 mg/dL (ref 8.6–10.2)
Chloride: 105 mmol/L (ref 96–106)
Creatinine, Ser: 0.86 mg/dL (ref 0.76–1.27)
Globulin, Total: 2.2 g/dL (ref 1.5–4.5)
Glucose: 145 mg/dL — ABNORMAL HIGH (ref 65–99)
Potassium: 4.3 mmol/L (ref 3.5–5.2)
Sodium: 143 mmol/L (ref 134–144)
Total Protein: 7.1 g/dL (ref 6.0–8.5)
eGFR: 99 mL/min/{1.73_m2} (ref >59)

## 2021-03-20 NOTE — Progress Notes (Signed)
Glucose is 145. Rest of CMP WNL.  CBC WNL.

## 2021-04-04 ENCOUNTER — Other Ambulatory Visit: Payer: Self-pay | Admitting: Rheumatology

## 2021-04-06 ENCOUNTER — Other Ambulatory Visit: Payer: Self-pay | Admitting: Physician Assistant

## 2021-04-06 DIAGNOSIS — Z79899 Other long term (current) drug therapy: Secondary | ICD-10-CM

## 2021-04-06 NOTE — Telephone Encounter (Signed)
Next Visit: 08/18/2021  Last Visit: 02/17/2021  Last Fill: 03/02/2021  DX: Primary osteoarthritis of both knees:  Current Dose per office note 02/17/2021,  Celebrex 200 mg 1 capsule by mouth twice daily for pain relief  Labs: 03/19/2021, Glucose is 145. Rest of CMP WNL. CBC WNL  Okay to refill Celebrex?

## 2021-04-06 NOTE — Telephone Encounter (Signed)
Next Visit: 08/18/2021  Last Visit: 02/17/2021  Last Fill: 01/05/2021  Dx:  DDD (degenerative disc disease), lumbar: Chronic pain.  Current Dose per office note on 02/17/2021, gabapentin 100 mg 2 capsules at bedtime  Okay to refill Gabapentin?

## 2021-04-14 ENCOUNTER — Other Ambulatory Visit: Payer: Self-pay | Admitting: Physician Assistant

## 2021-04-14 NOTE — Telephone Encounter (Signed)
Next Visit: 08/18/2021  Last Visit: 02/17/2021  Last Fill: 02/04/2021  Dx: Primary osteoarthritis of both hands   Current Dose per office note on 02/17/2021, Flexeril 5 mg by mouth at bedtime as needed for muscle spasms   Okay to refill flexeril?

## 2021-05-11 ENCOUNTER — Other Ambulatory Visit: Payer: Self-pay | Admitting: Physician Assistant

## 2021-05-11 DIAGNOSIS — Z79899 Other long term (current) drug therapy: Secondary | ICD-10-CM

## 2021-05-11 NOTE — Telephone Encounter (Signed)
Next Visit: 08/18/2021   Last Visit: 02/17/2021   Last Fill: 04/06/2021   DX: Primary osteoarthritis of both knees:   Current Dose per office note 02/17/2021,  Celebrex 200 mg 1 capsule by mouth twice daily for pain relief   Labs: 03/19/2021, Glucose is 145. Rest of CMP WNL.  CBC WNL   Okay to refill per Dr. Deveshwar  

## 2021-06-15 ENCOUNTER — Other Ambulatory Visit: Payer: Self-pay | Admitting: Rheumatology

## 2021-06-15 DIAGNOSIS — Z79899 Other long term (current) drug therapy: Secondary | ICD-10-CM

## 2021-06-16 NOTE — Telephone Encounter (Signed)
Next Visit: 08/18/2021   Last Visit: 02/17/2021   Last Fill: 04/06/2021   DX: Primary osteoarthritis of both knees:   Current Dose per office note 02/17/2021,  Celebrex 200 mg 1 capsule by mouth twice daily for pain relief   Labs: 03/19/2021, Glucose is 145. Rest of CMP WNL.  CBC WNL   Okay to refill per Dr. Corliss Skains

## 2021-06-28 ENCOUNTER — Other Ambulatory Visit: Payer: Self-pay | Admitting: Physician Assistant

## 2021-06-29 NOTE — Telephone Encounter (Signed)
Next Visit: 08/18/2021  Last Visit: 02/17/2021  Last Fill: 04/06/2021  DX: DDD (degenerative disc disease), lumbar  Current Dose per office note on 02/17/2021: gabapentin 100 mg 2 capsules at bedtime.  Okay to refill gabapentin?

## 2021-07-16 ENCOUNTER — Other Ambulatory Visit: Payer: Self-pay | Admitting: Rheumatology

## 2021-07-16 DIAGNOSIS — Z79899 Other long term (current) drug therapy: Secondary | ICD-10-CM

## 2021-07-16 NOTE — Telephone Encounter (Signed)
Next Visit: 08/18/2021  Last Visit: 02/17/2021  Last Fill: 06/16/2021  DX: Primary osteoarthritis of both hands   Current Dose per office note 02/17/2021: Celebrex 200 mg 1 capsule by mouth twice daily for pain relief  Labs: 03/19/2021 Glucose is 145. Rest of CMP WNL.  CBC WNL.    Okay to refill Celebrex?

## 2021-07-24 ENCOUNTER — Other Ambulatory Visit: Payer: Self-pay | Admitting: Rheumatology

## 2021-07-24 NOTE — Telephone Encounter (Signed)
Next Visit: 08/18/2021   Last Visit: 02/17/2021   Last Fill: 04/14/2021   Dx: DDD (degenerative disc disease), lumbar  Current Dose per office note on 02/17/2021: Flexeril 5 mg by mouth at bedtime as needed for muscle spasms  Okay to refill Flexeril?

## 2021-08-04 NOTE — Progress Notes (Signed)
Office Visit Note  Patient: [name] [name]             Date of Birth: Feb 29, 1961           MRN: [id number]             PCP: Carylon Perches, MD Referring: Carylon Perches, MD Visit Date: 08/18/2021 Occupation: @GUAROCC @  Subjective:  Other (Patient reports back pain and recent MRI. Patient states flexeril 5mg  is not helping the pain )   History of Present Illness: [name] [name] is a 61 y.o. male with a history of osteoarthritis and degenerative disc disease.  He states he continues to have some stiffness in his hands and knees joints.  His neck is not as bothersome.  He states his lower back pain has been very painful and he has difficulty even walking.  He has seen Dr. Noel Gerold who did recent MRI and is scheduled him to get epidural injections.  He states that epidural injections in the past did not last for a long time.  He also had done physical therapy in the past and did not find it very useful.  He has been doing stretching exercises at home.  He states Flexeril helps to some extent but is not very effective and he would like a higher dose of Flexeril.  He has been taking Celebrex on a regular basis and also gabapentin.  Activities of Daily Living:  Patient reports morning stiffness for all day. Patient Denies nocturnal pain.  Difficulty dressing/grooming: Reports Difficulty climbing stairs: Reports Difficulty getting out of chair: Reports Difficulty using hands for taps, buttons, cutlery, and/or writing: Denies  Review of Systems  Constitutional:  Positive for fatigue.  HENT:  Negative for mouth sores, mouth dryness and nose dryness.   Eyes:  Positive for dryness. Negative for pain and itching.  Respiratory:  Negative for shortness of breath and difficulty breathing.   Cardiovascular:  Negative for chest pain and palpitations.  Gastrointestinal:  Negative for blood in stool, constipation and diarrhea.  Endocrine: Negative for increased urination.  Genitourinary:  Negative for  difficulty urinating.  Musculoskeletal:  Positive for joint pain, joint pain and morning stiffness. Negative for joint swelling, myalgias, muscle tenderness and myalgias.  Skin:  Negative for color change, rash and redness.  Allergic/Immunologic: Negative for susceptible to infections.  Neurological:  Negative for dizziness, numbness, headaches and memory loss.  Hematological:  Positive for bruising/bleeding tendency.  Psychiatric/Behavioral:  Negative for confusion.    PMFS History:  Patient Active Problem List   Diagnosis Date Noted   History of environmental allergies 12/28/2016   Type 2 diabetes mellitus without complication, without long-term current use of insulin ([facility]) 12/28/2016   Primary osteoarthritis of both hands 12/25/2016   Gastroesophageal reflux disease without esophagitis 12/25/2016   Neuralgia 12/25/2016   Primary osteoarthritis of both knees 12/24/2016   Spondylosis of lumbar region without myelopathy or radiculopathy 12/24/2016   DJD (degenerative joint disease), cervical 12/24/2016   Obesity 10/08/2013   Arthritis of both knees 10/08/2013   LBP (low back pain) 10/08/2013   HTN (hypertension) 10/08/2013   Hypothyroidism 10/08/2013   Constipation 10/04/2012    Past Medical History:  Diagnosis Date   Arthritis    Chronic constipation    Constipation    Hypertension    Hyperthyroidism     Family History  Problem Relation Age of Onset   Lung disease Sister    Past Surgical History:  Procedure Laterality Date   ABDOMINAL HERNIA REPAIR  Patient states that it was a double hernia surgery   FOOT SURGERY     HIATAL HERNIA REPAIR     knee surgeries     neck surgeries     UPPER GASTROINTESTINAL ENDOSCOPY  1980's   Social History   Social History Narrative   Not on file   Immunization History  Administered Date(s) Administered   Moderna Sars-Covid-2 Vaccination 02/04/2020, 03/04/2020, 11/11/2020     Objective: Vital Signs: BP 125/84 (BP Location:  Left Arm, Patient Position: Sitting, Cuff Size: Normal)   Pulse (!) 101   Ht 6\' 3"  (1.905 m)   Wt 276 lb 9.6 oz (125.5 kg)   BMI 34.57 kg/m    Physical Exam Vitals and nursing note reviewed.  Constitutional:      Appearance: He is well-developed.  HENT:     Head: Normocephalic and atraumatic.  Eyes:     Conjunctiva/sclera: Conjunctivae normal.     Pupils: Pupils are equal, round, and reactive to light.  Cardiovascular:     Rate and Rhythm: Normal rate and regular rhythm.     Heart sounds: Normal heart sounds.  Pulmonary:     Effort: Pulmonary effort is normal.     Breath sounds: Normal breath sounds.  Abdominal:     General: Bowel sounds are normal.     Palpations: Abdomen is soft.  Musculoskeletal:     Cervical back: Normal range of motion and neck supple.  Skin:    General: Skin is warm and dry.     Capillary Refill: Capillary refill takes less than 2 seconds.  Neurological:     Mental Status: He is alert and oriented to person, place, and time.  Psychiatric:        Behavior: Behavior normal.     Musculoskeletal Exam: He had limited range of motion of cervical spine.  He had limited painful range of motion of his lumbar spine.  Shoulder joints, elbow joints, wrist joints with good range of motion.  He had bilateral PIP and DIP thickening.  Hip joints and knee joints in good range of motion.  He uses a brace on his left knee due to discomfort.  There was no tenderness over ankles or MTPs.  CDAI Exam: CDAI Score: -- Patient Global: --; Provider Global: -- Swollen: --; Tender: -- Joint Exam 08/18/2021   No joint exam has been documented for this visit   There is currently no information documented on the homunculus. Go to the Rheumatology activity and complete the homunculus joint exam.  Investigation: No additional findings.  Imaging: No results found.  Recent Labs: Lab Results  Component Value Date   WBC 7.3 03/19/2021   HGB 15.3 03/19/2021   PLT 233  03/19/2021   NA 143 03/19/2021   K 4.3 03/19/2021   CL 105 03/19/2021   CO2 19 (L) 03/19/2021   GLUCOSE 145 (H) 03/19/2021   BUN 15 03/19/2021   CREATININE 0.86 03/19/2021   BILITOT 0.7 03/19/2021   ALKPHOS 72 03/19/2021   AST 31 03/19/2021   ALT 34 03/19/2021   PROT 7.1 03/19/2021   ALBUMIN 4.9 03/19/2021   CALCIUM 9.7 03/19/2021   GFRAA 110 07/07/2020    Speciality Comments: No specialty comments available.  Procedures:  No procedures performed Allergies: Daypro [oxaprozin] and Morphine and related   Assessment / Plan:     Visit Diagnoses: Primary osteoarthritis of both hands-he has bilateral PIP and DIP thickening.  He continues to have a stiffness in his hands.  Joint  protection muscle strengthening was discussed.  Primary osteoarthritis of both knees-he has knee joint discomfort.  Lower extremity muscle strengthening exercises were discussed.  DDD (degenerative disc disease), cervical - Status post fusion.  He has some limitation with range of motion without much discomfort.  DDD (degenerative disc disease), lumbar - Flexeril 5 mg by mouth at bedtime as needed for muscle spasms and gabapentin 100 mg 2 capsules at bedtime.  He has been having increased lower back pain to the point he is having difficulty with mobility.  He has discomfort during all activities.  He is requesting to increase the Flexeril dose.  I will increase the dose of Flexeril from 5 mg to 10 mg.  Side effects of increased dose of Flexeril were discussed at length.  He has been strongly advised not to drive after taking Flexeril.Several core strengthening exercises were demonstrated in the office.  A handout on exercises was given.  I also offered physical therapy which she declined.  Medication monitoring encounter-he is taking Celebrex 200 mg p.o. twice daily.  We have monitoring his labs every 6 months.  His labs have been stable.  Neuralgia - gabapentin 100 mg 2 capsules by mouth at bedtime.  Other  medical problems are listed as follows:  Type 2 diabetes mellitus without complication, without long-term current use of insulin ([facility])  History of environmental allergies  Essential hypertension  History of hypothyroidism  History of gastroesophageal reflux (GERD)  Orders: No orders of the defined types were placed in this encounter.  Meds ordered this encounter  Medications   cyclobenzaprine (FLEXERIL) 10 MG tablet    Sig: Take 1 tablet (10 mg total) by mouth at bedtime.    Dispense:  30 tablet    Refill:  0      Follow-Up Instructions: Return in about 6 months (around 02/15/2022) for Osteoarthritis.   Pollyann Savoy, MD  Note - This record has been created using Animal nutritionist.  Chart creation errors have been sought, but may not always  have been located. Such creation errors do not reflect on  the standard of medical care.

## 2021-08-14 ENCOUNTER — Other Ambulatory Visit: Payer: Self-pay | Admitting: Rheumatology

## 2021-08-14 DIAGNOSIS — Z79899 Other long term (current) drug therapy: Secondary | ICD-10-CM

## 2021-08-14 NOTE — Telephone Encounter (Signed)
Next Visit: 08/18/2021   Last Visit: 02/17/2021   Last Fill: 07/16/2021   DX: Primary osteoarthritis of both hands    Current Dose per office note 02/17/2021: Celebrex 200 mg 1 capsule by mouth twice daily for pain relief   Labs: 03/19/2021 Glucose is 145. Rest of CMP WNL.  CBC WNL.     Okay to refill Celebrex?

## 2021-08-18 ENCOUNTER — Encounter: Payer: Self-pay | Admitting: Rheumatology

## 2021-08-18 ENCOUNTER — Ambulatory Visit (INDEPENDENT_AMBULATORY_CARE_PROVIDER_SITE_OTHER): Payer: Worker's Compensation | Admitting: Rheumatology

## 2021-08-18 ENCOUNTER — Other Ambulatory Visit: Payer: Self-pay

## 2021-08-18 VITALS — BP 125/84 | HR 101 | Ht 75.0 in | Wt 276.6 lb

## 2021-08-18 DIAGNOSIS — M5030 Other cervical disc degeneration, unspecified cervical region: Secondary | ICD-10-CM | POA: Diagnosis not present

## 2021-08-18 DIAGNOSIS — M170 Bilateral primary osteoarthritis of knee: Secondary | ICD-10-CM

## 2021-08-18 DIAGNOSIS — Z8639 Personal history of other endocrine, nutritional and metabolic disease: Secondary | ICD-10-CM

## 2021-08-18 DIAGNOSIS — M792 Neuralgia and neuritis, unspecified: Secondary | ICD-10-CM

## 2021-08-18 DIAGNOSIS — M5136 Other intervertebral disc degeneration, lumbar region: Secondary | ICD-10-CM

## 2021-08-18 DIAGNOSIS — M19042 Primary osteoarthritis, left hand: Secondary | ICD-10-CM

## 2021-08-18 DIAGNOSIS — E119 Type 2 diabetes mellitus without complications: Secondary | ICD-10-CM

## 2021-08-18 DIAGNOSIS — Z5181 Encounter for therapeutic drug level monitoring: Secondary | ICD-10-CM

## 2021-08-18 DIAGNOSIS — M19041 Primary osteoarthritis, right hand: Secondary | ICD-10-CM | POA: Diagnosis not present

## 2021-08-18 DIAGNOSIS — Z8719 Personal history of other diseases of the digestive system: Secondary | ICD-10-CM

## 2021-08-18 DIAGNOSIS — Z9109 Other allergy status, other than to drugs and biological substances: Secondary | ICD-10-CM

## 2021-08-18 DIAGNOSIS — I10 Essential (primary) hypertension: Secondary | ICD-10-CM

## 2021-08-18 MED ORDER — CYCLOBENZAPRINE HCL 10 MG PO TABS: 10.0000 mg | ORAL_TABLET | Freq: Every day | ORAL | 0 refills | Status: DC

## 2021-08-18 NOTE — Patient Instructions (Signed)

## 2021-09-11 ENCOUNTER — Other Ambulatory Visit: Payer: Self-pay | Admitting: Physician Assistant

## 2021-09-11 DIAGNOSIS — Z79899 Other long term (current) drug therapy: Secondary | ICD-10-CM

## 2021-09-11 NOTE — Telephone Encounter (Signed)
Next Visit: 02/16/2022  Last Visit: 08/18/2021  Last Fill: 08/14/2021 (30 day supply)  DX: Primary osteoarthritis of both hands  Current Dose per office note 08/18/2021: Celebrex 200 mg p.o. twice daily.   Labs: 03/19/2021 Glucose is 145. Rest of CMP WNL.  CBC WNL.    Okay to refill Celebrex?

## 2021-09-30 ENCOUNTER — Other Ambulatory Visit: Payer: Self-pay

## 2021-09-30 ENCOUNTER — Telehealth: Payer: Self-pay

## 2021-09-30 DIAGNOSIS — Z79899 Other long term (current) drug therapy: Secondary | ICD-10-CM

## 2021-09-30 NOTE — Telephone Encounter (Signed)
Elizabeth from [facility] in [place] called requesting labwork orders for the patient.  She states the patient stopped by their office this morning and told him that once she received the orders she would call him.   Phone #[phone] Fax #[phone]

## 2021-09-30 NOTE — Telephone Encounter (Signed)
Lab orders have been released and faxed to [facility] at the number provided.

## 2021-10-01 ENCOUNTER — Other Ambulatory Visit: Payer: Self-pay | Admitting: *Deleted

## 2021-10-01 MED ORDER — GABAPENTIN 100 MG PO CAPS: 200.0000 mg | ORAL_CAPSULE | Freq: Every day | ORAL | 2 refills | Status: DC

## 2021-10-01 NOTE — Telephone Encounter (Signed)
Patient requesting refill on Gabapentin.   Next Visit: 02/16/2022   Last Visit: 08/18/2021   Last Fill: 06/29/2021   DX: DDD (degenerative disc disease), lumbar    Current Dose per office note 08/18/2021: gabapentin 100 mg 2 capsules at bedtime.    Okay to refill Gabapentin?

## 2021-10-01 NOTE — Progress Notes (Signed)
CBC WNL

## 2021-10-02 LAB — CBC WITH DIFFERENTIAL/PLATELET
Absolute Monocytes: 559 cells/uL (ref 200–950)
Basophils Absolute: 59 cells/uL (ref 0–200)
Basophils Relative: 0.9 %
Eosinophils Absolute: 163 cells/uL (ref 15–500)
Eosinophils Relative: 2.5 %
HCT: 44.9 % (ref 38.5–50.0)
Hemoglobin: 14.9 g/dL (ref 13.2–17.1)
Lymphs Abs: 1820 cells/uL (ref 850–3900)
MCH: 29.6 pg (ref 27.0–33.0)
MCHC: 33.2 g/dL (ref 32.0–36.0)
MCV: 89.3 fL (ref 80.0–100.0)
MPV: 12.6 fL — ABNORMAL HIGH (ref 7.5–12.5)
Monocytes Relative: 8.6 %
Neutro Abs: 3900 cells/uL (ref 1500–7800)
Neutrophils Relative %: 60 %
Platelets: 228 10*3/uL (ref 140–400)
RBC: 5.03 10*6/uL (ref 4.20–5.80)
RDW: 12 % (ref 11.0–15.0)
Total Lymphocyte: 28 %
WBC: 6.5 10*3/uL (ref 3.8–10.8)

## 2021-10-02 LAB — COMPLETE METABOLIC PANEL WITH GFR
AG Ratio: 2.3 (calc) (ref 1.0–2.5)
ALT: 22 U/L (ref 9–46)
AST: 22 U/L (ref 10–35)
Albumin: 4.5 g/dL (ref 3.6–5.1)
Alkaline phosphatase (APISO): 73 U/L (ref 35–144)
BUN: 15 mg/dL (ref 7–25)
CO2: 23 mmol/L (ref 20–32)
Calcium: 9.5 mg/dL (ref 8.6–10.3)
Chloride: 107 mmol/L (ref 98–110)
Creat: 0.76 mg/dL (ref 0.70–1.35)
Globulin: 2 g/dL (calc) (ref 1.9–3.7)
Glucose, Bld: 133 mg/dL — ABNORMAL HIGH (ref 65–99)
Potassium: 4.4 mmol/L (ref 3.5–5.3)
Sodium: 140 mmol/L (ref 135–146)
Total Bilirubin: 0.4 mg/dL (ref 0.2–1.2)
Total Protein: 6.5 g/dL (ref 6.1–8.1)
eGFR: 102 mL/min/{1.73_m2} (ref >=60)

## 2021-10-02 NOTE — Progress Notes (Signed)
Glucose is 133.  Rest of CMP WNL.

## 2021-10-12 ENCOUNTER — Other Ambulatory Visit: Payer: Self-pay | Admitting: Physician Assistant

## 2021-10-12 DIAGNOSIS — Z79899 Other long term (current) drug therapy: Secondary | ICD-10-CM

## 2021-10-12 NOTE — Telephone Encounter (Signed)
Next Visit: 02/16/2022   Last Visit: 08/18/2021   Last Fill: 09/11/2021 (30 day supply)   DX: Primary osteoarthritis of both hands   Current Dose per office note 08/18/2021: Celebrex 200 mg p.o. twice daily.    Labs: 10/01/2021 Glucose is 133.  Rest of CMP WNL. CBC WNL   Okay to refill Celebrex?

## 2021-11-27 ENCOUNTER — Other Ambulatory Visit: Payer: Self-pay | Admitting: Rheumatology

## 2021-11-27 NOTE — Telephone Encounter (Signed)
Next Visit: 02/16/2022   Last Visit: 08/18/2021   Last Fill: 08/18/2021  Dx: DDD (degenerative disc disease), lumbar   Current Dose per office note 08/18/2021: Flexeril 5 mg by mouth at bedtime as needed for muscle spasms   Okay to refill Flexeril?

## 2021-12-31 ENCOUNTER — Other Ambulatory Visit: Payer: Self-pay | Admitting: Physician Assistant

## 2021-12-31 NOTE — Telephone Encounter (Signed)
Next Visit: 02/16/2022   Last Visit: 08/18/2021   Last Fill: 10/01/2021   DX: DDD (degenerative disc disease), lumbar    Current Dose per office note 08/18/2021: gabapentin 100 mg 2 capsules at bedtime.    Okay to refill Gabapentin?

## 2022-01-11 ENCOUNTER — Other Ambulatory Visit: Payer: Self-pay | Admitting: Physician Assistant

## 2022-01-11 DIAGNOSIS — Z79899 Other long term (current) drug therapy: Secondary | ICD-10-CM

## 2022-01-11 NOTE — Telephone Encounter (Signed)
Next Visit: 02/16/2022   Last Visit: 08/18/2021   Last Fill: 10/12/2021  DX: Primary osteoarthritis of both hands   Current Dose per office note 08/18/2021: Celebrex 200 mg p.o. twice daily.    Labs: 10/01/2021 Glucose is 133.  Rest of CMP WNL. CBC WNL   Okay to refill Celebrex?

## 2022-01-28 ENCOUNTER — Other Ambulatory Visit: Payer: Self-pay | Admitting: Physician Assistant

## 2022-01-28 NOTE — Telephone Encounter (Signed)
Next Visit: 02/16/2022 ?  ?Last Visit: 08/18/2021 ?  ?Last Fill: 12/31/2021 (30 day supply) ?  ?DX: DDD (degenerative disc disease), lumbar  ?  ?Current Dose per office note 08/18/2021: gabapentin 100 mg 2 capsules at bedtime.  ?  ?Okay to refill Gabapentin?  ?

## 2022-02-02 NOTE — Progress Notes (Unsigned)
Office Visit Note  Patient: [name] [name]             Date of Birth: 10-22-1961           MRN: [id number]             PCP: Carylon Perches, MD Referring: Carylon Perches, MD Visit Date: 02/16/2022 Occupation: @GUAROCC @  Subjective:  No chief complaint on file.   History of Present Illness: [name] [name] is a 61 y.o. male ***   Activities of Daily Living:  Patient reports morning stiffness for *** {minute/hour:19697}.   Patient {ACTIONS;DENIES/REPORTS:21021675::"Denies"} nocturnal pain.  Difficulty dressing/grooming: {ACTIONS;DENIES/REPORTS:21021675::"Denies"} Difficulty climbing stairs: {ACTIONS;DENIES/REPORTS:21021675::"Denies"} Difficulty getting out of chair: {ACTIONS;DENIES/REPORTS:21021675::"Denies"} Difficulty using hands for taps, buttons, cutlery, and/or writing: {ACTIONS;DENIES/REPORTS:21021675::"Denies"}  No Rheumatology ROS completed.   PMFS History:  Patient Active Problem List   Diagnosis Date Noted   History of environmental allergies 12/28/2016   Type 2 diabetes mellitus without complication, without long-term current use of insulin (HCC) 12/28/2016   Primary osteoarthritis of both hands 12/25/2016   Gastroesophageal reflux disease without esophagitis 12/25/2016   Neuralgia 12/25/2016   Primary osteoarthritis of both knees 12/24/2016   Spondylosis of lumbar region without myelopathy or radiculopathy 12/24/2016   DJD (degenerative joint disease), cervical 12/24/2016   Obesity 10/08/2013   Arthritis of both knees 10/08/2013   LBP (low back pain) 10/08/2013   HTN (hypertension) 10/08/2013   Hypothyroidism 10/08/2013   Constipation 10/04/2012    Past Medical History:  Diagnosis Date   Arthritis    Chronic constipation    Constipation    Hypertension    Hyperthyroidism     Family History  Problem Relation Age of Onset   Lung disease Sister    Past Surgical History:  Procedure Laterality Date   ABDOMINAL HERNIA REPAIR     Patient states that it was a  double hernia surgery   FOOT SURGERY     HIATAL HERNIA REPAIR     knee surgeries     neck surgeries     UPPER GASTROINTESTINAL ENDOSCOPY  1980's   Social History   Social History Narrative   Not on file   Immunization History  Administered Date(s) Administered   Moderna Sars-Covid-2 Vaccination 02/04/2020, 03/04/2020, 11/11/2020     Objective: Vital Signs: There were no vitals taken for this visit.   Physical Exam   Musculoskeletal Exam: ***  CDAI Exam: CDAI Score: -- Patient Global: --; Provider Global: -- Swollen: --; Tender: -- Joint Exam 02/16/2022   No joint exam has been documented for this visit   There is currently no information documented on the homunculus. Go to the Rheumatology activity and complete the homunculus joint exam.  Investigation: No additional findings.  Imaging: No results found.  Recent Labs: Lab Results  Component Value Date   WBC 6.5 10/01/2021   HGB 14.9 10/01/2021   PLT 228 10/01/2021   NA 140 10/01/2021   K 4.4 10/01/2021   CL 107 10/01/2021   CO2 23 10/01/2021   GLUCOSE 133 (H) 10/01/2021   BUN 15 10/01/2021   CREATININE 0.76 10/01/2021   BILITOT 0.4 10/01/2021   ALKPHOS 72 03/19/2021   AST 22 10/01/2021   ALT 22 10/01/2021   PROT 6.5 10/01/2021   ALBUMIN 4.9 03/19/2021   CALCIUM 9.5 10/01/2021   GFRAA 110 07/07/2020    Speciality Comments: No specialty comments available.  Procedures:  No procedures performed Allergies: Daypro [oxaprozin] and Morphine and related   Assessment / Plan:  Visit Diagnoses: No diagnosis found.  Orders: No orders of the defined types were placed in this encounter.  No orders of the defined types were placed in this encounter.   Face-to-face time spent with patient was *** minutes. Greater than 50% of time was spent in counseling and coordination of care.  Follow-Up Instructions: No follow-ups on file.   Ellen Henri, CMA  Note - This record has been created using  Animal nutritionist.  Chart creation errors have been sought, but may not always  have been located. Such creation errors do not reflect on  the standard of medical care.

## 2022-02-16 ENCOUNTER — Ambulatory Visit (INDEPENDENT_AMBULATORY_CARE_PROVIDER_SITE_OTHER): Payer: Worker's Compensation | Admitting: Physician Assistant

## 2022-02-16 ENCOUNTER — Other Ambulatory Visit: Payer: Self-pay

## 2022-02-16 ENCOUNTER — Encounter: Payer: Self-pay | Admitting: Physician Assistant

## 2022-02-16 VITALS — BP 120/85 | HR 102 | Ht 75.0 in | Wt 280.4 lb

## 2022-02-16 DIAGNOSIS — M170 Bilateral primary osteoarthritis of knee: Secondary | ICD-10-CM | POA: Diagnosis not present

## 2022-02-16 DIAGNOSIS — Z5181 Encounter for therapeutic drug level monitoring: Secondary | ICD-10-CM

## 2022-02-16 DIAGNOSIS — M792 Neuralgia and neuritis, unspecified: Secondary | ICD-10-CM

## 2022-02-16 DIAGNOSIS — E119 Type 2 diabetes mellitus without complications: Secondary | ICD-10-CM

## 2022-02-16 DIAGNOSIS — M5030 Other cervical disc degeneration, unspecified cervical region: Secondary | ICD-10-CM | POA: Diagnosis not present

## 2022-02-16 DIAGNOSIS — I10 Essential (primary) hypertension: Secondary | ICD-10-CM

## 2022-02-16 DIAGNOSIS — M19041 Primary osteoarthritis, right hand: Secondary | ICD-10-CM

## 2022-02-16 DIAGNOSIS — Z8639 Personal history of other endocrine, nutritional and metabolic disease: Secondary | ICD-10-CM

## 2022-02-16 DIAGNOSIS — M5136 Other intervertebral disc degeneration, lumbar region: Secondary | ICD-10-CM | POA: Diagnosis not present

## 2022-02-16 DIAGNOSIS — Z8719 Personal history of other diseases of the digestive system: Secondary | ICD-10-CM

## 2022-02-16 DIAGNOSIS — Z9109 Other allergy status, other than to drugs and biological substances: Secondary | ICD-10-CM

## 2022-02-16 DIAGNOSIS — M19042 Primary osteoarthritis, left hand: Secondary | ICD-10-CM

## 2022-03-08 ENCOUNTER — Other Ambulatory Visit: Payer: Self-pay | Admitting: Physician Assistant

## 2022-03-08 NOTE — Telephone Encounter (Signed)
Next Visit: 08/19/2022 ? ?Last Visit: 02/16/2022 ? ?Last Fill: 11/27/2021 ? ?DX: DDD (degenerative disc disease), lumbar ? ?Current Dose per office note on 02/16/2022: He has noticed improvement in his lower back discomfort since increasing the dose of Flexeril from 5 mg to 10 mg at bedtime as needed. ? ?Okay to refill flexeril?  ?

## 2022-04-14 ENCOUNTER — Telehealth: Payer: Self-pay | Admitting: Rheumatology

## 2022-04-14 ENCOUNTER — Other Ambulatory Visit: Payer: Self-pay | Admitting: Physician Assistant

## 2022-04-14 ENCOUNTER — Other Ambulatory Visit: Payer: Self-pay | Admitting: *Deleted

## 2022-04-14 DIAGNOSIS — Z5181 Encounter for therapeutic drug level monitoring: Secondary | ICD-10-CM

## 2022-04-14 DIAGNOSIS — Z79899 Other long term (current) drug therapy: Secondary | ICD-10-CM

## 2022-04-14 NOTE — Telephone Encounter (Signed)
Lab Orders released.  

## 2022-04-14 NOTE — Telephone Encounter (Signed)
Elizabeth from [facility] in [place] called stating patient came in at 7:00 am this morning to have labwork, but there were no orders.  She states patient will be returning on Friday, 04/16/22 and requested the orders be faxed to [phone] ?

## 2022-04-15 NOTE — Telephone Encounter (Signed)
Next Visit: 08/19/2022   Last Visit: 02/16/2022   Last Fill: 01/11/2022  DX: Primary osteoarthritis of both knees   Current Dose per office note on 02/16/2022: Celebrex 200 mg 1 capsule by mouth twice daily as needed for pain relief  Labs: 10/01/2021 Glucose is 133.  Rest of CMP WNL.   Patient plans to update labs on 04/16/2022.  Okay to refill Celebrex?

## 2022-04-17 LAB — CBC WITH DIFFERENTIAL/PLATELET
Absolute Monocytes: 607 cells/uL (ref 200–950)
Basophils Absolute: 40 cells/uL (ref 0–200)
Basophils Relative: 0.6 %
Eosinophils Absolute: 79 cells/uL (ref 15–500)
Eosinophils Relative: 1.2 %
HCT: 43 % (ref 38.5–50.0)
Hemoglobin: 14 g/dL (ref 13.2–17.1)
Lymphs Abs: 2033 cells/uL (ref 850–3900)
MCH: 29.4 pg (ref 27.0–33.0)
MCHC: 32.6 g/dL (ref 32.0–36.0)
MCV: 90.1 fL (ref 80.0–100.0)
MPV: 12.5 fL (ref 7.5–12.5)
Monocytes Relative: 9.2 %
Neutro Abs: 3841 cells/uL (ref 1500–7800)
Neutrophils Relative %: 58.2 %
Platelets: 232 10*3/uL (ref 140–400)
RBC: 4.77 10*6/uL (ref 4.20–5.80)
RDW: 11.9 % (ref 11.0–15.0)
Total Lymphocyte: 30.8 %
WBC: 6.6 10*3/uL (ref 3.8–10.8)

## 2022-04-17 LAB — COMPLETE METABOLIC PANEL WITH GFR
AG Ratio: 2.3 (calc) (ref 1.0–2.5)
ALT: 24 U/L (ref 9–46)
AST: 19 U/L (ref 10–35)
Albumin: 4.4 g/dL (ref 3.6–5.1)
Alkaline phosphatase (APISO): 56 U/L (ref 35–144)
BUN: 22 mg/dL (ref 7–25)
CO2: 21 mmol/L (ref 20–32)
Calcium: 9.1 mg/dL (ref 8.6–10.3)
Chloride: 109 mmol/L (ref 98–110)
Creat: 0.76 mg/dL (ref 0.70–1.35)
Globulin: 1.9 g/dL (calc) (ref 1.9–3.7)
Glucose, Bld: 117 mg/dL — ABNORMAL HIGH (ref 65–99)
Potassium: 4.4 mmol/L (ref 3.5–5.3)
Sodium: 140 mmol/L (ref 135–146)
Total Bilirubin: 0.4 mg/dL (ref 0.2–1.2)
Total Protein: 6.3 g/dL (ref 6.1–8.1)
eGFR: 102 mL/min/{1.73_m2} (ref >=60)

## 2022-04-19 NOTE — Progress Notes (Signed)
Glucose is 117.  Rest of CMP WNL.  CBC WNL.

## 2022-04-28 ENCOUNTER — Other Ambulatory Visit: Payer: Self-pay | Admitting: Rheumatology

## 2022-04-28 NOTE — Telephone Encounter (Signed)
Next Visit: 08/19/2022  Last Visit: 02/16/2022  Last Fill: 01/28/2022  Dx: DDD (degenerative disc disease), lumbar  Current Dose per office note on 02/16/2022: gabapentin 100 mg 2 capsules at bedtime  Okay to refill Gabapentin?

## 2022-06-14 ENCOUNTER — Other Ambulatory Visit: Payer: Self-pay | Admitting: Physician Assistant

## 2022-06-14 DIAGNOSIS — Z79899 Other long term (current) drug therapy: Secondary | ICD-10-CM

## 2022-07-12 ENCOUNTER — Telehealth: Payer: Self-pay | Admitting: Rheumatology

## 2022-07-12 DIAGNOSIS — Z5181 Encounter for therapeutic drug level monitoring: Secondary | ICD-10-CM

## 2022-07-12 NOTE — Telephone Encounter (Signed)
Patient called the office requesting lab orders be sent to [facility] in [place]. Patient states he is going on August 23rd.

## 2022-07-15 ENCOUNTER — Other Ambulatory Visit: Payer: Self-pay | Admitting: Physician Assistant

## 2022-07-15 DIAGNOSIS — Z79899 Other long term (current) drug therapy: Secondary | ICD-10-CM

## 2022-07-15 NOTE — Telephone Encounter (Signed)
Next Visit: 08/19/2022   Last Visit: 02/16/2022   Last Fill: 04/15/2022   DX: Primary osteoarthritis of both knees   Current Dose per office note on 02/16/2022: Celebrex 200 mg 1 capsule by mouth twice daily as needed for pain relief   Labs: 04/16/2022 Glucose is 117.  Rest of CMP WNL.  CBC WNL.    Okay to refill Celebrex?

## 2022-07-20 NOTE — Telephone Encounter (Signed)
Lab Orders released.  

## 2022-07-22 LAB — CBC WITH DIFFERENTIAL/PLATELET
Absolute Monocytes: 642 cells/uL (ref 200–950)
Basophils Absolute: 48 cells/uL (ref 0–200)
Basophils Relative: 0.7 %
Eosinophils Absolute: 117 cells/uL (ref 15–500)
Eosinophils Relative: 1.7 %
HCT: 43.5 % (ref 38.5–50.0)
Hemoglobin: 14.5 g/dL (ref 13.2–17.1)
Lymphs Abs: 2098 cells/uL (ref 850–3900)
MCH: 29.4 pg (ref 27.0–33.0)
MCHC: 33.3 g/dL (ref 32.0–36.0)
MCV: 88.1 fL (ref 80.0–100.0)
MPV: 11.7 fL (ref 7.5–12.5)
Monocytes Relative: 9.3 %
Neutro Abs: 3995 cells/uL (ref 1500–7800)
Neutrophils Relative %: 57.9 %
Platelets: 229 10*3/uL (ref 140–400)
RBC: 4.94 10*6/uL (ref 4.20–5.80)
RDW: 12.1 % (ref 11.0–15.0)
Total Lymphocyte: 30.4 %
WBC: 6.9 10*3/uL (ref 3.8–10.8)

## 2022-07-22 LAB — COMPLETE METABOLIC PANEL WITH GFR
AG Ratio: 2.3 (calc) (ref 1.0–2.5)
ALT: 26 U/L (ref 9–46)
AST: 24 U/L (ref 10–35)
Albumin: 4.6 g/dL (ref 3.6–5.1)
Alkaline phosphatase (APISO): 57 U/L (ref 35–144)
BUN: 19 mg/dL (ref 7–25)
CO2: 22 mmol/L (ref 20–32)
Calcium: 9.7 mg/dL (ref 8.6–10.3)
Chloride: 110 mmol/L (ref 98–110)
Creat: 0.82 mg/dL (ref 0.70–1.35)
Globulin: 2 g/dL (calc) (ref 1.9–3.7)
Glucose, Bld: 105 mg/dL — ABNORMAL HIGH (ref 65–99)
Potassium: 4.3 mmol/L (ref 3.5–5.3)
Sodium: 142 mmol/L (ref 135–146)
Total Bilirubin: 0.5 mg/dL (ref 0.2–1.2)
Total Protein: 6.6 g/dL (ref 6.1–8.1)
eGFR: 100 mL/min/{1.73_m2} (ref >=60)

## 2022-07-26 ENCOUNTER — Other Ambulatory Visit: Payer: Self-pay | Admitting: Physician Assistant

## 2022-07-27 NOTE — Telephone Encounter (Signed)
Next Visit: 08/19/2022  Last Visit: 02/16/2022  Last Fill:04/28/2022  Dx: Neuralgia  Current Dose per office note on 02/16/2022:  gabapentin 100 mg 2 capsules by mouth at bedtime.  Okay to refill Gabapentin?

## 2022-08-05 NOTE — Progress Notes (Signed)
Office Visit Note  Patient: [name] [name]             Date of Birth: May 04, 1961           MRN: [id number]             PCP: Carylon Perches, MD Referring: Carylon Perches, MD Visit Date: 08/19/2022 Occupation: @GUAROCC @  Subjective:  Pain in multiple joints  History of Present Illness: [name] [name] is a 61 y.o. male with history of osteoarthritis and degenerative disc disease.  He states he continues to have pain and stiffness in his bilateral hands especially over the [facility] joints.  He continues to have pain and discomfort in his bilateral knee joints but he has not noticed any joint swelling.  He has stiffness in his cervical spine.  His lower back pain persist.  He also has left-sided sciatica.  He has been evaluated at [facility] office.  He has had injections in the past.  He has been taking Celebrex 200 mg p.o. twice daily which helps to relieve his symptoms.  He has been taking cyclobenzaprine 10 mg p.o. nightly as needed for muscle spasms.  He is a still on gabapentin 200 mg p.o. nightly.  Activities of Daily Living:  Patient reports morning stiffness for all day. Patient Denies nocturnal pain.  Difficulty dressing/grooming: Denies Difficulty climbing stairs: Reports Difficulty getting out of chair: Reports Difficulty using hands for taps, buttons, cutlery, and/or writing: Reports  Review of Systems  Constitutional:  Positive for fatigue.  HENT:  Positive for mouth dryness. Negative for mouth sores.   Eyes:  Positive for dryness.  Respiratory:  Positive for shortness of breath.        Unchanged  Cardiovascular:  Negative for chest pain and palpitations.  Gastrointestinal:  Negative for blood in stool, constipation and diarrhea.  Endocrine: Negative for increased urination.  Genitourinary:  Negative for involuntary urination.  Musculoskeletal:  Positive for joint pain, gait problem, joint pain, myalgias, muscle weakness, morning stiffness, muscle tenderness and myalgias.  Negative for joint swelling.  Skin:  Negative for color change, rash, hair loss and sensitivity to sunlight.  Allergic/Immunologic: Negative for susceptible to infections.  Neurological:  Positive for headaches. Negative for dizziness.  Hematological:  Negative for swollen glands.  Psychiatric/Behavioral:  Negative for depressed mood and sleep disturbance. The patient is not nervous/anxious.     PMFS History:  Patient Active Problem List   Diagnosis Date Noted   History of environmental allergies 12/28/2016   Type 2 diabetes mellitus without complication, without long-term current use of insulin (HCC) 12/28/2016   Primary osteoarthritis of both hands 12/25/2016   Gastroesophageal reflux disease without esophagitis 12/25/2016   Neuralgia 12/25/2016   Primary osteoarthritis of both knees 12/24/2016   Spondylosis of lumbar region without myelopathy or radiculopathy 12/24/2016   DJD (degenerative joint disease), cervical 12/24/2016   Obesity 10/08/2013   Arthritis of both knees 10/08/2013   LBP (low back pain) 10/08/2013   HTN (hypertension) 10/08/2013   Hypothyroidism 10/08/2013   Constipation 10/04/2012    Past Medical History:  Diagnosis Date   Arthritis    Chronic constipation    Constipation    Hypertension    Hyperthyroidism     Family History  Problem Relation Age of Onset   Lung disease Sister    Past Surgical History:  Procedure Laterality Date   ABDOMINAL HERNIA REPAIR     Patient states that it was a double hernia surgery   FOOT SURGERY  HIATAL HERNIA REPAIR     knee surgeries     neck surgeries     UPPER GASTROINTESTINAL ENDOSCOPY  1980's   Social History   Social History Narrative   Not on file   Immunization History  Administered Date(s) Administered   Moderna Sars-Covid-2 Vaccination 02/04/2020, 03/04/2020, 11/11/2020     Objective: Vital Signs: BP 127/84 (BP Location: Left Arm, Patient Position: Sitting, Cuff Size: Large)   Pulse 87   Resp  18   Ht 6\' 3"  (1.905 m)   Wt 280 lb 9.6 oz (127.3 kg)   BMI 35.07 kg/m    Physical Exam Vitals and nursing note reviewed.  Constitutional:      Appearance: He is well-developed.  HENT:     Head: Normocephalic and atraumatic.  Eyes:     Conjunctiva/sclera: Conjunctivae normal.     Pupils: Pupils are equal, round, and reactive to light.  Cardiovascular:     Rate and Rhythm: Normal rate and regular rhythm.     Heart sounds: Normal heart sounds.  Pulmonary:     Effort: Pulmonary effort is normal.     Breath sounds: Normal breath sounds.  Abdominal:     General: Bowel sounds are normal.     Palpations: Abdomen is soft.  Musculoskeletal:     Cervical back: Normal range of motion and neck supple.  Skin:    General: Skin is warm and dry.     Capillary Refill: Capillary refill takes less than 2 seconds.  Neurological:     Mental Status: He is alert and oriented to person, place, and time.  Psychiatric:        Behavior: Behavior normal.      Musculoskeletal Exam: He had limited lateral rotation of the cervical spine.  He had painful range of motion of his lumbar spine.  There was no point tenderness over the thoracic or lumbar spine.  Shoulder joints, elbow joints, wrist joints, MCPs PIPs and DIPs with good range of motion.  He had tenderness over CMC joints and some of the PIP and DIP joints.  Hip joints were in good range of motion with some discomfort over the left trochanteric region.  Knee joints were in good range of motion without any warmth swelling or effusion.  There was no tenderness over ankles or MTPs.  CDAI Exam: CDAI Score: -- Patient Global: --; Provider Global: -- Swollen: --; Tender: -- Joint Exam 08/19/2022   No joint exam has been documented for this visit   There is currently no information documented on the homunculus. Go to the Rheumatology activity and complete the homunculus joint exam.  Investigation: No additional findings.  Imaging: No results  found.  Recent Labs: Lab Results  Component Value Date   WBC 6.9 07/21/2022   HGB 14.5 07/21/2022   PLT 229 07/21/2022   NA 142 07/21/2022   K 4.3 07/21/2022   CL 110 07/21/2022   CO2 22 07/21/2022   GLUCOSE 105 (H) 07/21/2022   BUN 19 07/21/2022   CREATININE 0.82 07/21/2022   BILITOT 0.5 07/21/2022   ALKPHOS 72 03/19/2021   AST 24 07/21/2022   ALT 26 07/21/2022   PROT 6.6 07/21/2022   ALBUMIN 4.9 03/19/2021   CALCIUM 9.7 07/21/2022   GFRAA 110 07/07/2020    Speciality Comments: No specialty comments available.  Procedures:  No procedures performed Allergies: Daypro [oxaprozin] and Morphine and related   Assessment / Plan:     Visit Diagnoses: Primary osteoarthritis of both hands-he continues  to have pain and discomfort in his bilateral hands which she describes over [facility] PIP and DIP joints.  No synovitis was noted.  Joint protection muscle strengthening was discussed.  He has been also taking Celebrex which has been helpful.  Primary osteoarthritis of both knees-he complains of ongoing discomfort in his knee joints.  He uses compression sleeve which helps.  He also has been using Voltaren gel.  Lower extremity muscle strengthening exercises and weight loss was emphasized.  DDD (degenerative disc disease), cervical - Status post fusion.  He has limited lateral rotation and chronic discomfort.  He denies any radiculopathy.  He has been doing stretching exercises at home.  DDD (degenerative disc disease), lumbar-he continues to have lower back pain.  He has been taking Celebrex and Flexeril on as needed basis.  He also takes gabapentin which helps.  Core strengthening exercises were emphasized.  Medication monitoring encounter - Celebrex 200 mg 1 capsule by mouth twice daily.  Labs obtained on July 21, 2022 CBC with differential and CMP with GFR were normal.  Side effects of Celebrex use including the increased risk of GI bleed, elevated LFTs with elevated creatinine were  discussed.  He has been taking Celebrex with meals.  Neuralgia -he has noted improvement and neurology is on gabapentin 100 mg 2 capsules by mouth at bedtime.  Other medical problems are listed as follows:  Type 2 diabetes mellitus without complication, without long-term current use of insulin (HCC)  History of environmental allergies  Essential hypertension-blood pressure was normal today.  History of hypothyroidism  History of gastroesophageal reflux (GERD)  Orders: No orders of the defined types were placed in this encounter.  No orders of the defined types were placed in this encounter.    Follow-Up Instructions: Return in about 6 months (around 02/17/2023) for Osteoarthritis.   Pollyann Savoy, MD  Note - This record has been created using Animal nutritionist.  Chart creation errors have been sought, but may not always  have been located. Such creation errors do not reflect on  the standard of medical care.

## 2022-08-14 ENCOUNTER — Other Ambulatory Visit: Payer: Self-pay | Admitting: Physician Assistant

## 2022-08-16 NOTE — Telephone Encounter (Signed)
Next Visit: 08/19/2022   Last Visit: 02/16/2022   Last Fill: 03/08/2022  DX: DDD (degenerative disc disease), lumbar   Current Dose per office note on 02/16/2022: He has noticed improvement in his lower back discomfort since increasing the dose of Flexeril from 5 mg to 10 mg at bedtime as needed.   Okay to refill flexeril?

## 2022-08-19 ENCOUNTER — Encounter: Payer: Self-pay | Admitting: Rheumatology

## 2022-08-19 ENCOUNTER — Ambulatory Visit: Payer: PRIVATE HEALTH INSURANCE | Attending: Rheumatology | Admitting: Rheumatology

## 2022-08-19 VITALS — BP 127/84 | HR 87 | Resp 18 | Ht 75.0 in | Wt 280.6 lb

## 2022-08-19 DIAGNOSIS — M5136 Other intervertebral disc degeneration, lumbar region: Secondary | ICD-10-CM

## 2022-08-19 DIAGNOSIS — E119 Type 2 diabetes mellitus without complications: Secondary | ICD-10-CM

## 2022-08-19 DIAGNOSIS — M19041 Primary osteoarthritis, right hand: Secondary | ICD-10-CM

## 2022-08-19 DIAGNOSIS — M5030 Other cervical disc degeneration, unspecified cervical region: Secondary | ICD-10-CM

## 2022-08-19 DIAGNOSIS — M792 Neuralgia and neuritis, unspecified: Secondary | ICD-10-CM

## 2022-08-19 DIAGNOSIS — Z8719 Personal history of other diseases of the digestive system: Secondary | ICD-10-CM

## 2022-08-19 DIAGNOSIS — M170 Bilateral primary osteoarthritis of knee: Secondary | ICD-10-CM | POA: Diagnosis not present

## 2022-08-19 DIAGNOSIS — M19042 Primary osteoarthritis, left hand: Secondary | ICD-10-CM

## 2022-08-19 DIAGNOSIS — Z8639 Personal history of other endocrine, nutritional and metabolic disease: Secondary | ICD-10-CM

## 2022-08-19 DIAGNOSIS — Z9109 Other allergy status, other than to drugs and biological substances: Secondary | ICD-10-CM

## 2022-08-19 DIAGNOSIS — Z5181 Encounter for therapeutic drug level monitoring: Secondary | ICD-10-CM

## 2022-08-19 DIAGNOSIS — I10 Essential (primary) hypertension: Secondary | ICD-10-CM

## 2022-08-29 ENCOUNTER — Other Ambulatory Visit: Payer: Self-pay | Admitting: Physician Assistant

## 2022-08-30 NOTE — Telephone Encounter (Signed)
Next Visit: 02/28/2023  Last Visit: 08/19/2022  Last Fill: 07/27/2022  Dx: Neuralgia  Current Dose per office note on 08/19/2022: gabapentin 100 mg 2 capsules by mouth at bedtime.   Okay to refill Gabapentin?

## 2022-09-12 ENCOUNTER — Other Ambulatory Visit: Payer: Self-pay | Admitting: Physician Assistant

## 2022-09-12 DIAGNOSIS — Z79899 Other long term (current) drug therapy: Secondary | ICD-10-CM

## 2022-09-13 NOTE — Telephone Encounter (Signed)
Next Visit: 02/28/2023   Last Visit: 08/19/2022   Last Fill: 07/15/2022  Dx: Primary osteoarthritis of both hands   Current Dose per office note on 08/19/2022: Celebrex 200 mg 1 capsule by mouth twice daily  Labs: 07/21/2022 CBC WNL.  Glucose is 105. Rest of CMP WNL.   Okay to refill Celebrex?

## 2022-11-15 ENCOUNTER — Other Ambulatory Visit: Payer: Self-pay | Admitting: Physician Assistant

## 2022-11-15 DIAGNOSIS — Z79899 Other long term (current) drug therapy: Secondary | ICD-10-CM

## 2022-11-15 NOTE — Telephone Encounter (Signed)
Next Visit: 02/28/2023   Last Visit: 08/19/2022   Last Fill: 09/13/2022  Dx:  Primary osteoarthritis of both hands   Current Dose per office note on 08/19/2022:  not discussed   Labs: 07/21/2022 CBC WNL.  Glucose is 105. Rest of CMP WNL.   Okay to refill Celebrex?

## 2022-11-29 ENCOUNTER — Other Ambulatory Visit: Payer: Self-pay | Admitting: Physician Assistant

## 2022-11-30 NOTE — Telephone Encounter (Signed)
Next Visit: 02/28/2023  Last Visit: 08/19/2022  Last Fill: 08/30/2022  Dx: Neuralgia   Current Dose per office note on 08/19/2022: gabapentin 100 mg 2 capsules by mouth at bedtime.    Okay to refill Gabapentin?

## 2023-02-01 ENCOUNTER — Emergency Department (HOSPITAL_COMMUNITY)
Admission: EM | Admit: 2023-02-01 | Discharge: 2023-02-01 | Disposition: A | Discharge status: Home / Self Care | Payer: BC Managed Care – PPO | Attending: Emergency Medicine | Admitting: Emergency Medicine

## 2023-02-01 ENCOUNTER — Other Ambulatory Visit: Payer: Self-pay

## 2023-02-01 ENCOUNTER — Encounter (HOSPITAL_COMMUNITY): Payer: Self-pay | Admitting: *Deleted

## 2023-02-01 ENCOUNTER — Emergency Department (HOSPITAL_COMMUNITY): Payer: BC Managed Care – PPO

## 2023-02-01 DIAGNOSIS — W01198A Fall on same level from slipping, tripping and stumbling with subsequent striking against other object, initial encounter: Secondary | ICD-10-CM | POA: Insufficient documentation

## 2023-02-01 DIAGNOSIS — S63259A Unspecified dislocation of unspecified finger, initial encounter: Secondary | ICD-10-CM

## 2023-02-01 DIAGNOSIS — Y9301 Activity, walking, marching and hiking: Secondary | ICD-10-CM | POA: Diagnosis not present

## 2023-02-01 DIAGNOSIS — Z7982 Long term (current) use of aspirin: Secondary | ICD-10-CM | POA: Diagnosis not present

## 2023-02-01 DIAGNOSIS — S6991XA Unspecified injury of right wrist, hand and finger(s), initial encounter: Secondary | ICD-10-CM | POA: Diagnosis present

## 2023-02-01 DIAGNOSIS — S63256A Unspecified dislocation of right little finger, initial encounter: Secondary | ICD-10-CM | POA: Diagnosis not present

## 2023-02-01 NOTE — ED Provider Notes (Signed)
[facility] Provider Note   CSN: [id number] Arrival date & time: 02/01/23  0915     History  Chief Complaint  Patient presents with   Finger Injury    [name] [name] is a 62 y.o. male who presents to the ED for evaluation of right little finger injury. Patient reports he was walking out of eye doctor PTA and fell over concrete parking block landing on right hand. Patient denies hitting head, LOC, blood thinners.   HPI     Home Medications Prior to Admission medications   Medication Sig Start Date End Date Taking? Authorizing Provider  ASPIRIN 81 PO Take 81 mg by mouth daily.    [provider]  atorvastatin (LIPITOR) 20 MG tablet Take 20 mg by mouth daily.    [provider]  Blood Glucose Monitoring Suppl (ONE TOUCH ULTRA 2) w/Device KIT  01/01/19   [provider]  celecoxib (CELEBREX) 200 MG capsule TAKE ONE CAPSULE BY MOUTH TWICE DAILY AS NEEDED WITH FOOD. 11/15/22   Ofilia Neas, PA-C  cyclobenzaprine (FLEXERIL) 10 MG tablet TAKE 1 TABLET BY MOUTH ONCE AT BEDTIME. 08/16/22   Ofilia Neas, PA-C  fexofenadine (ALLEGRA) 180 MG tablet Take 180 mg by mouth daily. Patient not taking: Reported on 08/18/2021    [provider]  gabapentin (NEURONTIN) 100 MG capsule TAKE 2 CAPSULES BY MOUTH AT BEDTIME. 11/30/22   Ofilia Neas, PA-C  JARDIANCE 10 MG TABS tablet Take 10 mg by mouth daily. Patient not taking: Reported on 08/18/2021 07/10/20   [provider]  JARDIANCE 25 MG TABS tablet Take 25 mg by mouth daily. 07/16/21   [provider]  Lancets (ONETOUCH DELICA PLUS [id number]) [place]  01/01/19   [provider]  levothyroxine (SYNTHROID, LEVOTHROID) 112 MCG tablet Take 112 mcg by mouth daily before breakfast.    [provider]  LINZESS 145 MCG CAPS capsule TAKE ONE CAPSULE BY MOUTH TWICE A DAY Patient taking differently: Take 145 mcg by mouth as needed. 07/29/16   Setzer,  Rona Ravens, NP  losartan (COZAAR) 50 MG tablet Take 50 mg by mouth at bedtime. 12/26/19   [provider]  metFORMIN (GLUCOPHAGE) 500 MG tablet Take 500 mg by mouth 2 (two) times daily with a meal.    [provider]  ONE TOUCH ULTRA TEST test strip  02/14/19   [provider]  Polyvinyl Alcohol-Povidone ([place] OP) Apply to eye.    [provider]  TRULICITY [id number] [id number] SOPN SMARTSIG:1 SUB-Q Once a Week 07/16/22   [provider]  VOLTAREN 1 % GEL APPLY 2-4 GRAMS TO AFFECTED JOINTS UP TO 4 TIMES DAILY. 09/03/20   Bo Merino, MD  XIIDRA 5 % SOLN INSTILL 1 DROP INTO BOTH EYES TWICE A DAY 01/28/18   [provider]      Allergies    Daypro [oxaprozin] and Morphine and related    Review of Systems   Review of Systems  Musculoskeletal:  Positive for arthralgias.  All other systems reviewed and are negative.   Physical Exam Updated Vital Signs BP (!) 153/93 (BP Location: Left Arm)   Pulse 85   Temp 98 F (36.7 C) (Oral)   Resp 14   Wt 122.5 kg   SpO2 97%   BMI 33.75 kg/m  Physical Exam Vitals and nursing note reviewed.  Constitutional:      General: He is not in acute distress.    Appearance:  He is well-developed.  HENT:     Head: Normocephalic and atraumatic.  Eyes:     Conjunctiva/sclera: Conjunctivae normal.  Cardiovascular:     Rate and Rhythm: Normal rate and regular rhythm.     Heart sounds: No murmur heard. Pulmonary:     Effort: Pulmonary effort is normal. No respiratory distress.     Breath sounds: Normal breath sounds.  Abdominal:     Palpations: Abdomen is soft.     Tenderness: There is no abdominal tenderness.  Musculoskeletal:        General: No swelling.     Cervical back: Neck supple.     Comments: Obviously dislocated right little finger with superficial abrasion to tip of finger.   Skin:    General: Skin is warm and dry.     Capillary Refill: Capillary refill takes less than 2 seconds.        Neurological:     Mental Status: He is alert.  Psychiatric:        Mood and Affect: Mood normal.     ED Results / Procedures / Treatments   Labs (all labs ordered are listed, but only abnormal results are displayed) Labs Reviewed - No data to display  EKG None  Radiology DG Finger Ring Right  Result Date: 02/01/2023 CLINICAL DATA:  Trauma, postreduction views EXAM: RIGHT RING FINGER 2+V COMPARISON:  Study done earlier today FINDINGS: There is interval reduction of dorsal dislocation of PIP joint. There are small smooth marginated calcific densities adjacent to the PIP joint suggesting recent or old avulsions. Minimal bony spurs are noted in the distal interphalangeal joints. IMPRESSION: There is satisfactory interval reduction of dislocated PIP joint of right fifth finger. There are small calcific densities adjacent to the PIP joint suggesting recent or old avulsion fractures. Electronically Signed   By: Elmer Picker M.D.   On: 02/01/2023 09:57   DG Finger Little Right  Result Date: 02/01/2023 CLINICAL DATA:  Trauma with dislocation EXAM: RIGHT LITTLE FINGER 2+V COMPARISON:  None Available. FINDINGS: Dislocation at the PIP joint with the middle phalanx being dislocated medially and dorsally. Small fracture fragments are present which could derive from both the distal proximal phalanx and the proximal middle phalanx. IMPRESSION: Dislocation at the PIP joint with the middle phalanx being dislocated medially and dorsally. Small regional fracture fragments that could derive from the proximal phalanx and or middle phalanx. Electronically Signed   By: Nelson Chimes M.D.   On: 02/01/2023 09:41    Procedures Procedures   Medications Ordered in ED Medications - No data to display  ED Course/ Medical Decision Making/ A&P  :1}                          Medical Decision Making Amount and/or Complexity of Data Reviewed Radiology: ordered.   62 year old male presents to the ED for  evaluation of right little finger injury.  Please see HPI for further details.  On examination the patient has an obviously dislocated right little finger along with superficial abrasion to the palm of his left hand and his right elbow.  Patient has superficial abrasion at tip of finger however no deeper laceration needing repair.  Imaging was obtained to confirm dislocation.  I then discussed risks and benefits of reduction of finger and patient agreed to procedure.  Patient finger was reduced by myself with my attending Dr. Vanita Panda at the bedside.  The patient finger was then buddy taped in  position and postreduction films were obtained which did show successful postreduction.  Patient also noted to have small fragments so we will place finger in static splint and have patient follow-up with his orthopedic doctor.  The patient abrasion to the tip of his finger is not deep, does not represent open fracture so will not treat with Keflex at this time.  Patient left hand superficial abrasion, right elbow superficial abrasion were cleansed with wound cleanser and covered with gauze.  Return precautions discussed and the patient voiced understanding.  The patient all his questions answered to his satisfaction.  The patient is stable for discharge.  Update: Prior to discharge nursing staff notified me the patient wished to speak with me.  Patient stated that he has "had fractures before, it feels fine, I do not need to splint".  I advised the patient that in these situations we typically splint and have him follow-up with orthopedics however the patient states he does not wish to do this.  Patient is of sound mind and body, alert and oriented x 3, able to make medical decisions for himself.  Splint was again offered however patient again deferred on this.  Patient is discharged without splint.  Final Clinical Impression(s) / ED Diagnoses Final diagnoses:  Dislocation of finger, initial encounter    Rx /  DC Orders ED Discharge Orders     None         Lawana Chambers 02/01/23 1018    Carmin Muskrat, MD 02/02/23 [phone]

## 2023-02-01 NOTE — ED Triage Notes (Signed)
Pt tripped and fell and injured his right little finger.  Pt finger is jammed and he can not move it.  Small amount of dried blood at the tip of right little finger.  Last tetanus unknown.  Pt is alert and oriented and denies any other injuries.

## 2023-02-01 NOTE — Discharge Instructions (Addendum)
Return to the ED with any new or worsening signs or symptoms Please follow-up with orthopedics, Dr. Aline Brochure, for reevaluation Please remain in finger splint until seen by orthopedics

## 2023-02-02 NOTE — ED Provider Notes (Signed)
.  Ortho Injury Treatment  Date/Time: 02/01/2023 9:30 AM  Performed by: Carmin Muskrat, MD Authorized by: Carmin Muskrat, MD   Consent:    Consent obtained:  Verbal   Consent given by:  Patient   Risks discussed:  Restricted joint movement, recurrent dislocation, irreducible dislocation, fracture and nerve damage   Alternatives discussed:  No treatment, alternative treatment and immobilization Universal protocol:    Procedure explained and questions answered to patient or proxy's satisfaction: yes     Test results available and properly labeled: yes     Imaging studies available: yes     Required blood products, implants, devices, and special equipment available: yes     Site/side marked: yes     Immediately prior to procedure a time out was called: yes     Patient identity confirmed:  Verbally with patientInjury location: finger Location details: right little finger Injury type: fracture-dislocation Fracture type: proximal phalanx MCP joint involved: no Any IP joint involved: yes Pre-procedure neurovascular assessment: neurovascularly intact Pre-procedure distal perfusion: normal Pre-procedure neurological function: normal Pre-procedure range of motion: reduced  Anesthesia: Local anesthesia used: no  Patient sedated: NoManipulation performed: yes Skeletal traction used: yes Reduction successful: yes X-ray confirmed reduction: yes Immobilization: tape Splint Applied by: ED Provider Post-procedure neurovascular assessment: post-procedure neurovascularly intact Post-procedure distal perfusion: normal Post-procedure neurological function: normal Post-procedure range of motion: improved        Carmin Muskrat, MD 02/02/23 [phone]

## 2023-02-14 NOTE — Progress Notes (Signed)
Office Visit Note  Patient: [name] [name]             Date of Birth: [birth year]           MRN: [id number]             PCP: Asencion Noble, MD Referring: Asencion Noble, MD Visit Date: 02/28/2023 Occupation: @GUAROCC @  Subjective:  Arthralgias  History of Present Illness: GORGE KEHR is a 62 y.o. male with history of osteoarthritis and DDD.  Patient had a fall on 02/01/2023 at which time he dislocated his right fifth finger.  He was evaluated in the emergency department at which time his finger was reduced and splinted.  He states that the pain and range of motion in his right fifth digit has improved.  He continues to have ongoing arthralgias and joint stiffness especially in his C-spine, right shoulder, both hands, both knee joints.  He has been wearing compression braces on both knees for support.  He is not ready to proceed with knee replacements at this time.  Patient is taking celebrex 200 mg 1 capsule BID.  He takes Flexeril sparingly for muscle spasms.  He remains on gabapentin at bedtime.  Activities of Daily Living:  Patient reports joint stiffness all day Patient Denies nocturnal pain.  Difficulty dressing/grooming: Denies Difficulty climbing stairs: Reports Difficulty getting out of chair: Reports Difficulty using hands for taps, buttons, cutlery, and/or writing: Reports  Review of Systems  Constitutional:  Negative for fatigue.  HENT:  Negative for mouth sores and mouth dryness.   Eyes:  Positive for dryness.  Respiratory:  Negative for shortness of breath.   Cardiovascular:  Negative for chest pain and palpitations.  Gastrointestinal:  Negative for blood in stool, constipation and diarrhea.  Endocrine: Negative for increased urination.  Genitourinary:  Negative for involuntary urination.  Musculoskeletal:  Positive for joint pain, joint pain, joint swelling, myalgias, muscle weakness, morning stiffness and myalgias. Negative for gait problem and muscle tenderness.  Skin:   Negative for color change, rash, hair loss and sensitivity to sunlight.  Allergic/Immunologic: Negative for susceptible to infections.  Neurological:  Negative for dizziness and headaches.  Hematological:  Negative for swollen glands.  Psychiatric/Behavioral:  Positive for sleep disturbance. Negative for depressed mood. The patient is not nervous/anxious.     PMFS History:  Patient Active Problem List   Diagnosis Date Noted   History of environmental allergies 12/28/2016   Type 2 diabetes mellitus without complication, without long-term current use of insulin 12/28/2016   Primary osteoarthritis of both hands 12/25/2016   Gastroesophageal reflux disease without esophagitis 12/25/2016   Neuralgia 12/25/2016   Primary osteoarthritis of both knees 12/24/2016   Spondylosis of lumbar region without myelopathy or radiculopathy 12/24/2016   DJD (degenerative joint disease), cervical 12/24/2016   Obesity 10/08/2013   Arthritis of both knees 10/08/2013   LBP (low back pain) 10/08/2013   HTN (hypertension) 10/08/2013   Hypothyroidism 10/08/2013   Constipation 10/04/2012    Past Medical History:  Diagnosis Date   Arthritis    Chronic constipation    Constipation    Hypertension    Hyperthyroidism     Family History  Problem Relation Age of Onset   Lung disease Sister    Past Surgical History:  Procedure Laterality Date   ABDOMINAL HERNIA REPAIR     Patient states that it was a double hernia surgery   FOOT SURGERY     HIATAL HERNIA REPAIR     knee surgeries  neck surgeries     UPPER GASTROINTESTINAL ENDOSCOPY  1980's   Social History   Social History Narrative   Not on file   Immunization History  Administered Date(s) Administered   Moderna Sars-Covid-2 Vaccination 02/04/2020, 03/04/2020, 11/11/2020     Objective: Vital Signs: BP 121/78 (BP Location: Left Arm, Patient Position: Sitting, Cuff Size: Normal)   Pulse 92   Resp 16   Ht 6\' 3"  (1.905 m)   Wt 278 lb (126.1  kg)   BMI 34.75 kg/m    Physical Exam Vitals and nursing note reviewed.  Constitutional:      Appearance: He is well-developed.  HENT:     Head: Normocephalic and atraumatic.  Eyes:     Conjunctiva/sclera: Conjunctivae normal.     Pupils: Pupils are equal, round, and reactive to light.  Cardiovascular:     Rate and Rhythm: Normal rate and regular rhythm.     Heart sounds: Normal heart sounds.  Pulmonary:     Effort: Pulmonary effort is normal.     Breath sounds: Normal breath sounds.  Abdominal:     General: Bowel sounds are normal.     Palpations: Abdomen is soft.  Musculoskeletal:     Cervical back: Normal range of motion and neck supple.  Skin:    General: Skin is warm and dry.     Capillary Refill: Capillary refill takes less than 2 seconds.  Neurological:     Mental Status: He is alert and oriented to person, place, and time.  Psychiatric:        Behavior: Behavior normal.      Musculoskeletal Exam: C-spine has limited range of motion with lateral rotation.  Some discomfort with range of motion of the right shoulder.  Elbow joints have good range of motion.  Wrist joints, MCPs, PIPs, DIPs have good range of motion with no synovitis.  Some tenderness over both CMC joints.  PIP and DIP thickening consistent with osteoarthritis of both hands.  Hip joints have good range of motion with no groin pain.  Tenderness over the trochanteric bursa bilaterally.  Painful range of motion of both knee joints with warmth in the right knee.  Ankle joints have good range of motion with no tenderness or joint swelling.  CDAI Exam: CDAI Score: -- Patient Global: --; Provider Global: -- Swollen: --; Tender: -- Joint Exam 02/28/2023   No joint exam has been documented for this visit   There is currently no information documented on the homunculus. Go to the Rheumatology activity and complete the homunculus joint exam.  Investigation: No additional findings.  Imaging: DG Finger Ring  Right  Result Date: 02/01/2023 CLINICAL DATA:  Trauma, postreduction views EXAM: RIGHT RING FINGER 2+V COMPARISON:  Study done earlier today FINDINGS: There is interval reduction of dorsal dislocation of PIP joint. There are small smooth marginated calcific densities adjacent to the PIP joint suggesting recent or old avulsions. Minimal bony spurs are noted in the distal interphalangeal joints. IMPRESSION: There is satisfactory interval reduction of dislocated PIP joint of right fifth finger. There are small calcific densities adjacent to the PIP joint suggesting recent or old avulsion fractures. Electronically Signed   By: Elmer Picker M.D.   On: 02/01/2023 09:57   DG Finger Little Right  Result Date: 02/01/2023 CLINICAL DATA:  Trauma with dislocation EXAM: RIGHT LITTLE FINGER 2+V COMPARISON:  None Available. FINDINGS: Dislocation at the PIP joint with the middle phalanx being dislocated medially and dorsally. Small fracture fragments are present which  could derive from both the distal proximal phalanx and the proximal middle phalanx. IMPRESSION: Dislocation at the PIP joint with the middle phalanx being dislocated medially and dorsally. Small regional fracture fragments that could derive from the proximal phalanx and or middle phalanx. Electronically Signed   By: Nelson Chimes M.D.   On: 02/01/2023 09:41    Recent Labs: Lab Results  Component Value Date   WBC 6.9 07/21/2022   HGB 14.5 07/21/2022   PLT 229 07/21/2022   NA 142 07/21/2022   K 4.3 07/21/2022   CL 110 07/21/2022   CO2 22 07/21/2022   GLUCOSE 105 (H) 07/21/2022   BUN 19 07/21/2022   CREATININE 0.82 07/21/2022   BILITOT 0.5 07/21/2022   ALKPHOS 72 03/19/2021   AST 24 07/21/2022   ALT 26 07/21/2022   PROT 6.6 07/21/2022   ALBUMIN 4.9 03/19/2021   CALCIUM 9.7 07/21/2022   GFRAA 110 07/07/2020    Speciality Comments: No specialty comments available.  Procedures:  No procedures performed Allergies: Daypro [oxaprozin] and  Morphine and related   Assessment / Plan:     Visit Diagnoses: Primary osteoarthritis of both hands: CMC joint prominence noted bilaterally.  PIP and DIP thickening consistent with osteoarthritis of both hands.  Discussed the importance of joint protection and muscle strengthening.  He was encouraged to perform hand exercises daily.  I also discussed the use of arthritis compression gloves.  He uses Voltaren gel sparingly for symptomatic relief.  He also takes Celebrex 200 mg 1 tablet twice daily as needed for pain relief.  He was advised to notify us if he develops any new or worsening symptoms.  He will follow-up in the office in 6 months or sooner if needed.  Primary osteoarthritis of both knees: Chronic pain and stiffness, right knee greater than left.  He wears compression braces on both knees for support.  He is not ready to proceed with knee replacements at this time.  He remains on Celebrex 200 mg twice daily as needed for symptomatic relief.  Discussed the importance of lower extremity muscle strengthening.  DDD (degenerative disc disease), cervical - S/p fusion: Limited range of motion with lateral rotation.  He tries to perform neck range of motion exercises.  He remains on gabapentin as prescribed.  He takes Flexeril sparingly.  DDD (degenerative disc disease), lumbar: Chronic pain and stiffness.  Patient takes Flexeril sparingly to manage his symptoms.  He remains on gabapentin 200 mg at bedtime.  Medication monitoring encounter - Celebrex 200 mg 1 capsule by mouth twice daily.  Patient plans on having updated lab work at the [facility] in [place].  Standing orders for CBC and CMP were placed today.  He was also given a prescription with CBC and CMP orders. - Plan: COMPLETE METABOLIC PANEL WITH GFR, CBC with Differential/Platelet, CBC with Differential/Platelet, COMPLETE METABOLIC PANEL WITH GFR  Neuralgia: He remains on gabapentin 200 mg at bedtime.  Other medical conditions are listed as  follows:  Type 2 diabetes mellitus without complication, without long-term current use of insulin  Essential hypertension: Blood pressure was 121/78 today in the office.  History of gastroesophageal reflux (GERD)  History of hypothyroidism  History of environmental allergies  Orders: Orders Placed This Encounter  Procedures   COMPLETE METABOLIC PANEL WITH GFR   CBC with Differential/Platelet   CBC with Differential/Platelet   COMPLETE METABOLIC PANEL WITH GFR   No orders of the defined types were placed in this encounter.     Follow-Up Instructions:  Return in about 6 months (around 08/30/2023) for Osteoarthritis, DDD.   Ofilia Neas, PA-C  Note - This record has been created using Dragon software.  Chart creation errors have been sought, but may not always  have been located. Such creation errors do not reflect on  the standard of medical care.

## 2023-02-21 ENCOUNTER — Other Ambulatory Visit: Payer: Self-pay | Admitting: Physician Assistant

## 2023-02-21 DIAGNOSIS — Z79899 Other long term (current) drug therapy: Secondary | ICD-10-CM

## 2023-02-21 NOTE — Telephone Encounter (Signed)
Need to update CBC and CMP at upcoming appointment.

## 2023-02-21 NOTE — Telephone Encounter (Signed)
Last Fill: 11/15/2022  Labs: 07/21/2022  CBC WNL.  Glucose is 105. Rest of CMP WNL.   Next Visit: 02/28/2023  Last Visit: 08/19/2022  DX: DDD (degenerative disc disease)   Current Dose per office note 08/19/2022: Celebrex 200 mg 1 capsule by mouth twice daily.   Okay to refill Celebrex?

## 2023-02-28 ENCOUNTER — Encounter: Payer: Self-pay | Admitting: Physician Assistant

## 2023-02-28 ENCOUNTER — Ambulatory Visit: Payer: PRIVATE HEALTH INSURANCE | Admitting: Rheumatology

## 2023-02-28 ENCOUNTER — Ambulatory Visit: Payer: Worker's Compensation | Attending: Rheumatology | Admitting: Physician Assistant

## 2023-02-28 VITALS — BP 121/78 | HR 92 | Resp 16 | Ht 75.0 in | Wt 278.0 lb

## 2023-02-28 DIAGNOSIS — Z8719 Personal history of other diseases of the digestive system: Secondary | ICD-10-CM

## 2023-02-28 DIAGNOSIS — Z5181 Encounter for therapeutic drug level monitoring: Secondary | ICD-10-CM

## 2023-02-28 DIAGNOSIS — M19041 Primary osteoarthritis, right hand: Secondary | ICD-10-CM

## 2023-02-28 DIAGNOSIS — M170 Bilateral primary osteoarthritis of knee: Secondary | ICD-10-CM | POA: Diagnosis not present

## 2023-02-28 DIAGNOSIS — M792 Neuralgia and neuritis, unspecified: Secondary | ICD-10-CM

## 2023-02-28 DIAGNOSIS — Z9109 Other allergy status, other than to drugs and biological substances: Secondary | ICD-10-CM

## 2023-02-28 DIAGNOSIS — M19042 Primary osteoarthritis, left hand: Secondary | ICD-10-CM

## 2023-02-28 DIAGNOSIS — I10 Essential (primary) hypertension: Secondary | ICD-10-CM

## 2023-02-28 DIAGNOSIS — Z8639 Personal history of other endocrine, nutritional and metabolic disease: Secondary | ICD-10-CM

## 2023-02-28 DIAGNOSIS — M5030 Other cervical disc degeneration, unspecified cervical region: Secondary | ICD-10-CM

## 2023-02-28 DIAGNOSIS — E119 Type 2 diabetes mellitus without complications: Secondary | ICD-10-CM

## 2023-02-28 DIAGNOSIS — M51369 Other intervertebral disc degeneration, lumbar region without mention of lumbar back pain or lower extremity pain: Secondary | ICD-10-CM

## 2023-02-28 DIAGNOSIS — M5136 Other intervertebral disc degeneration, lumbar region: Secondary | ICD-10-CM

## 2023-03-03 ENCOUNTER — Other Ambulatory Visit: Payer: Self-pay | Admitting: *Deleted

## 2023-03-03 MED ORDER — GABAPENTIN 100 MG PO CAPS: 200.0000 mg | ORAL_CAPSULE | Freq: Every day | ORAL | 2 refills | Status: DC

## 2023-03-03 NOTE — Telephone Encounter (Signed)
Last Fill: 11/30/2022  Next Visit: 08/30/2023  Last Visit: 02/28/2023  Dx: DDD (degenerative disc disease), lumbar   Current Dose per office note on 02/28/2023: gabapentin 200 mg at bedtime.   Okay to refill Gabapentin?

## 2023-03-29 ENCOUNTER — Other Ambulatory Visit: Payer: Self-pay | Admitting: Physician Assistant

## 2023-03-29 DIAGNOSIS — Z79899 Other long term (current) drug therapy: Secondary | ICD-10-CM

## 2023-03-29 NOTE — Telephone Encounter (Signed)
Last Fill: 02/21/2023  Labs: 07/21/2022 CBC WNL.  Glucose is 105. Rest of CMP WNL.   Next Visit: 08/30/2023  Last Visit: 02/28/2023  DX: Primary osteoarthritis of both hands   Current Dose per office note on 02/28/2023: Celebrex 200 mg 1 capsule by mouth twice daily.   Patient states he will update labs on 04/21/2023 at Dr. Alonza Smoker office.   Okay to refill celebrex?

## 2023-04-22 LAB — LAB REPORT - SCANNED: EGFR: 97

## 2023-04-25 ENCOUNTER — Other Ambulatory Visit: Payer: Self-pay | Admitting: Physician Assistant

## 2023-04-26 NOTE — Telephone Encounter (Signed)
Last Fill: 08/16/2022  Next Visit: 08/30/2023  Last Visit: 02/28/2023  Dx: DDD (degenerative disc disease), lumbar   Current Dose per office note on 02/28/2023: Flexeril sparingly to manage his symptoms   Okay to refill Flexeril?

## 2023-04-28 ENCOUNTER — Telehealth: Payer: Self-pay | Admitting: *Deleted

## 2023-04-28 NOTE — Telephone Encounter (Signed)
Labs received from:[facility]  Drawn on:04/22/2023  Reviewed by:Dr. Pollyann Savoy  Labs drawn:CMP and CBC w/Diff  Results:Glucose 113

## 2023-04-30 ENCOUNTER — Other Ambulatory Visit: Payer: Self-pay | Admitting: Physician Assistant

## 2023-04-30 DIAGNOSIS — Z79899 Other long term (current) drug therapy: Secondary | ICD-10-CM

## 2023-05-02 NOTE — Telephone Encounter (Signed)
Last Fill: 03/29/2023  Labs: Labs received from:[facility]   Drawn on:04/22/2023   Reviewed by:Dr. Pollyann Savoy   Labs drawn:CMP and CBC w/Diff   Results:Glucose          113  Next Visit: 08/30/2023  Last Visit: 02/28/2023  DX: Primary osteoarthritis of both hands   Current Dose per office note 02/28/2023: Celebrex 200 mg 1 capsule by mouth twice daily.   Okay to refill Celebrex?

## 2023-05-03 ENCOUNTER — Other Ambulatory Visit: Payer: Self-pay | Admitting: Physician Assistant

## 2023-06-03 ENCOUNTER — Other Ambulatory Visit: Payer: Self-pay | Admitting: Physician Assistant

## 2023-06-03 DIAGNOSIS — Z79899 Other long term (current) drug therapy: Secondary | ICD-10-CM

## 2023-06-03 NOTE — Telephone Encounter (Signed)
Last Fill: 03/03/2023- gabapentin and 05/02/2023 celebrex  Labs: 04/22/2023 glucose 113  Next Visit: 08/30/2023  Last Visit: 02/28/2023  DX: Primary osteoarthritis of both hands, DDD (degenerative disc disease), lumbar   Current Dose per office note on 02/28/2023: Celebrex 200 mg 1 tablet twice daily as needed for pain relief, gabapentin 200 mg at bedtime.   Okay to refill celebrex and gabapentin?

## 2023-07-04 ENCOUNTER — Other Ambulatory Visit: Payer: Self-pay | Admitting: Physician Assistant

## 2023-07-04 DIAGNOSIS — Z79899 Other long term (current) drug therapy: Secondary | ICD-10-CM

## 2023-07-04 NOTE — Telephone Encounter (Signed)
Last Fill: 06/03/2023 (30 day supply)  Labs: 04/22/2023 glucose 113   Next Visit: 08/30/2023  Last Visit: 02/28/2023  DX: Primary osteoarthritis of both hands   Current Dose per office note 02/28/2023: Celebrex 200 mg 1 tablet twice daily as needed for pain relief,   Okay to refill Celebrex?

## 2023-08-18 NOTE — Progress Notes (Deleted)
Office Visit Note  Patient: [name] [name]             Date of Birth: 1961/10/05           MRN: [id number]             PCP: Carylon Perches, MD Referring: Carylon Perches, MD Visit Date: 08/30/2023 Occupation: @GUAROCC @  Subjective:  No chief complaint on file.   History of Present Illness: [name] [name] is a 62 y.o. male ***     Activities of Daily Living:  Patient reports morning stiffness for *** {minute/hour:19697}.   Patient {ACTIONS;DENIES/REPORTS:21021675::"Denies"} nocturnal pain.  Difficulty dressing/grooming: {ACTIONS;DENIES/REPORTS:21021675::"Denies"} Difficulty climbing stairs: {ACTIONS;DENIES/REPORTS:21021675::"Denies"} Difficulty getting out of chair: {ACTIONS;DENIES/REPORTS:21021675::"Denies"} Difficulty using hands for taps, buttons, cutlery, and/or writing: {ACTIONS;DENIES/REPORTS:21021675::"Denies"}  No Rheumatology ROS completed.   PMFS History:  Patient Active Problem List   Diagnosis Date Noted   History of environmental allergies 12/28/2016   Type 2 diabetes mellitus without complication, without long-term current use of insulin (HCC) 12/28/2016   Primary osteoarthritis of both hands 12/25/2016   Gastroesophageal reflux disease without esophagitis 12/25/2016   Neuralgia 12/25/2016   Primary osteoarthritis of both knees 12/24/2016   Spondylosis of lumbar region without myelopathy or radiculopathy 12/24/2016   DJD (degenerative joint disease), cervical 12/24/2016   Obesity 10/08/2013   Arthritis of both knees 10/08/2013   LBP (low back pain) 10/08/2013   HTN (hypertension) 10/08/2013   Hypothyroidism 10/08/2013   Constipation 10/04/2012    Past Medical History:  Diagnosis Date   Arthritis    Chronic constipation    Constipation    Hypertension    Hyperthyroidism     Family History  Problem Relation Age of Onset   Lung disease Sister    Past Surgical History:  Procedure Laterality Date   ABDOMINAL HERNIA REPAIR     Patient states that it was  a double hernia surgery   FOOT SURGERY     HIATAL HERNIA REPAIR     knee surgeries     neck surgeries     UPPER GASTROINTESTINAL ENDOSCOPY  1980's   Social History   Social History Narrative   Not on file   Immunization History  Administered Date(s) Administered   Moderna Sars-Covid-2 Vaccination 02/04/2020, 03/04/2020, 11/11/2020     Objective: Vital Signs: There were no vitals taken for this visit.   Physical Exam   Musculoskeletal Exam: ***  CDAI Exam: CDAI Score: -- Patient Global: --; Provider Global: -- Swollen: --; Tender: -- Joint Exam 08/30/2023   No joint exam has been documented for this visit   There is currently no information documented on the homunculus. Go to the Rheumatology activity and complete the homunculus joint exam.  Investigation: No additional findings.  Imaging: No results found.  Recent Labs: Lab Results  Component Value Date   WBC 6.9 07/21/2022   HGB 14.5 07/21/2022   PLT 229 07/21/2022   NA 142 07/21/2022   K 4.3 07/21/2022   CL 110 07/21/2022   CO2 22 07/21/2022   GLUCOSE 105 (H) 07/21/2022   BUN 19 07/21/2022   CREATININE 0.82 07/21/2022   BILITOT 0.5 07/21/2022   ALKPHOS 72 03/19/2021   AST 24 07/21/2022   ALT 26 07/21/2022   PROT 6.6 07/21/2022   ALBUMIN 4.9 03/19/2021   CALCIUM 9.7 07/21/2022   GFRAA 110 07/07/2020    Speciality Comments: No specialty comments available.  Procedures:  No procedures performed Allergies: Daypro [oxaprozin] and Morphine and codeine   Assessment /  Plan:     Visit Diagnoses: No diagnosis found.  Orders: No orders of the defined types were placed in this encounter.  No orders of the defined types were placed in this encounter.   Face-to-face time spent with patient was *** minutes. Greater than 50% of time was spent in counseling and coordination of care.  Follow-Up Instructions: No follow-ups on file.   Ellen Henri, CMA  Note - This record has been created using  Animal nutritionist.  Chart creation errors have been sought, but may not always  have been located. Such creation errors do not reflect on  the standard of medical care.

## 2023-08-30 ENCOUNTER — Ambulatory Visit: Payer: PRIVATE HEALTH INSURANCE | Admitting: Rheumatology

## 2023-08-30 DIAGNOSIS — Z8639 Personal history of other endocrine, nutritional and metabolic disease: Secondary | ICD-10-CM

## 2023-08-30 DIAGNOSIS — Z9109 Other allergy status, other than to drugs and biological substances: Secondary | ICD-10-CM

## 2023-08-30 DIAGNOSIS — E119 Type 2 diabetes mellitus without complications: Secondary | ICD-10-CM

## 2023-08-30 DIAGNOSIS — Z5181 Encounter for therapeutic drug level monitoring: Secondary | ICD-10-CM

## 2023-08-30 DIAGNOSIS — I10 Essential (primary) hypertension: Secondary | ICD-10-CM

## 2023-08-30 DIAGNOSIS — M170 Bilateral primary osteoarthritis of knee: Secondary | ICD-10-CM

## 2023-08-30 DIAGNOSIS — Z8719 Personal history of other diseases of the digestive system: Secondary | ICD-10-CM

## 2023-08-30 DIAGNOSIS — M5136 Other intervertebral disc degeneration, lumbar region: Secondary | ICD-10-CM

## 2023-08-30 DIAGNOSIS — M19041 Primary osteoarthritis, right hand: Secondary | ICD-10-CM

## 2023-08-30 DIAGNOSIS — M792 Neuralgia and neuritis, unspecified: Secondary | ICD-10-CM

## 2023-08-30 DIAGNOSIS — M5030 Other cervical disc degeneration, unspecified cervical region: Secondary | ICD-10-CM

## 2023-09-05 ENCOUNTER — Other Ambulatory Visit: Payer: Self-pay | Admitting: Rheumatology

## 2023-09-05 MED ORDER — GABAPENTIN 100 MG PO CAPS: 200.0000 mg | ORAL_CAPSULE | Freq: Every day | ORAL | 2 refills | Status: DC

## 2023-09-05 NOTE — Telephone Encounter (Signed)
Patient contacted the office stating he needs a refill of Gabapentin sent to [facility].  Last Fill: 06/03/2023  Next Visit: 09/27/2023  Last Visit: 02/28/2023  Dx: DDD (degenerative disc disease), lumbar   Current Dose per office note on 02/28/2023: gabapentin 200 mg at bedtime.   Okay to refill Gabapentin?  Marland Kitchen

## 2023-09-05 NOTE — Telephone Encounter (Signed)
Patient called requesting a return call.  Patient refused to give the reason for the call only that he "needed to speak with the nurse."

## 2023-09-14 NOTE — Progress Notes (Signed)
Office Visit Note  Patient: [name] [name]             Date of Birth: 09-08-1961           MRN: [id number]             PCP: Carylon Perches, MD Referring: Carylon Perches, MD Visit Date: 09/27/2023 Occupation: @GUAROCC @  Subjective:  Pain in multiple joints  History of Present Illness: [name] [name] is a 62 y.o. male with osteoarthritis and degenerative disc disease.  He states he continues to have pain and stiffness in his bilateral hands, shoulders, knees, cervical and lumbar spine.  He states he is unable to function without Celebrex.  He has been taking Celebrex 200 mg p.o. twice a day.  He occasionally takes  Flexeril for lower back pain.  He continues to take gabapentin on a regular basis.  He has not noticed any joint swelling.    Activities of Daily Living:  Patient reports morning stiffness for all day. Patient Reports nocturnal pain.  Difficulty dressing/grooming: Denies Difficulty climbing stairs: Reports Difficulty getting out of chair: Reports Difficulty using hands for taps, buttons, cutlery, and/or writing: Denies  Review of Systems  Constitutional:  Positive for fatigue.  HENT:  Positive for mouth dryness. Negative for mouth sores.   Eyes:  Negative for dryness.  Respiratory:  Positive for shortness of breath.   Cardiovascular:  Negative for chest pain and palpitations.  Gastrointestinal:  Negative for blood in stool, constipation and diarrhea.  Endocrine: Negative for increased urination.  Genitourinary:  Negative for involuntary urination.  Musculoskeletal:  Positive for joint pain, gait problem, joint pain, myalgias, muscle weakness, morning stiffness, muscle tenderness and myalgias. Negative for joint swelling.  Skin:  Negative for color change, rash and sensitivity to sunlight.  Allergic/Immunologic: Negative for susceptible to infections.  Neurological:  Positive for dizziness and headaches.  Hematological:  Negative for swollen glands.  Psychiatric/Behavioral:   Positive for sleep disturbance. Negative for depressed mood. The patient is not nervous/anxious.     PMFS History:  Patient Active Problem List   Diagnosis Date Noted   History of environmental allergies 12/28/2016   Type 2 diabetes mellitus without complication, without long-term current use of insulin (HCC) 12/28/2016   Primary osteoarthritis of both hands 12/25/2016   Gastroesophageal reflux disease without esophagitis 12/25/2016   Neuralgia 12/25/2016   Primary osteoarthritis of both knees 12/24/2016   Spondylosis of lumbar region without myelopathy or radiculopathy 12/24/2016   DJD (degenerative joint disease), cervical 12/24/2016   Obesity 10/08/2013   Arthritis of both knees 10/08/2013   Low back pain 10/08/2013   HTN (hypertension) 10/08/2013   Hypothyroidism 10/08/2013   Constipation 10/04/2012    Past Medical History:  Diagnosis Date   Arthritis    Chronic constipation    Constipation    Hypertension    Hyperthyroidism     Family History  Problem Relation Age of Onset   Lung disease Sister    Past Surgical History:  Procedure Laterality Date   ABDOMINAL HERNIA REPAIR     Patient states that it was a double hernia surgery   FOOT SURGERY     HIATAL HERNIA REPAIR     knee surgeries     neck surgeries     UPPER GASTROINTESTINAL ENDOSCOPY  1980's   Social History   Social History Narrative   Not on file   Immunization History  Administered Date(s) Administered   Moderna Sars-Covid-2 Vaccination 02/04/2020, 03/04/2020, 11/11/2020  Objective: Vital Signs: BP 121/78 (BP Location: Right Arm, Patient Position: Sitting, Cuff Size: Normal)   Pulse 91   Resp 18   Ht 6\' 3"  (1.905 m)   Wt 279 lb 9.6 oz (126.8 kg)   BMI 34.95 kg/m    Physical Exam Vitals and nursing note reviewed.  Constitutional:      Appearance: He is well-developed.  HENT:     Head: Normocephalic and atraumatic.  Eyes:     Conjunctiva/sclera: Conjunctivae normal.     Pupils:  Pupils are equal, round, and reactive to light.  Cardiovascular:     Rate and Rhythm: Normal rate and regular rhythm.     Heart sounds: Normal heart sounds.  Pulmonary:     Effort: Pulmonary effort is normal.     Breath sounds: Normal breath sounds.  Abdominal:     General: Bowel sounds are normal.     Palpations: Abdomen is soft.  Musculoskeletal:     Cervical back: Normal range of motion and neck supple.  Skin:    General: Skin is warm and dry.     Capillary Refill: Capillary refill takes less than 2 seconds.  Neurological:     Mental Status: He is alert and oriented to person, place, and time.  Psychiatric:        Behavior: Behavior normal.      Musculoskeletal Exam: He had limited range of motion of the cervical spine.  There was no tenderness on palpation of the thoracic and lumbar spine.  He discomfort range of motion of the lumbar spine.  Shoulder joints, elbow joints, wrist joints, MCPs PIPs and DIPs were in good range of motion.  He had bilateral PIP and DIP thickening.  Hip joints and knee joints in good range of motion without any warmth swelling or effusion.  There was no tenderness over ankles or MTPs.  CDAI Exam: CDAI Score: -- Patient Global: --; Provider Global: -- Swollen: --; Tender: -- Joint Exam 09/27/2023   No joint exam has been documented for this visit   There is currently no information documented on the homunculus. Go to the Rheumatology activity and complete the homunculus joint exam.  Investigation: No additional findings.  Imaging: No results found.  Recent Labs: Lab Results  Component Value Date   WBC 6.9 07/21/2022   HGB 14.5 07/21/2022   PLT 229 07/21/2022   NA 142 07/21/2022   K 4.3 07/21/2022   CL 110 07/21/2022   CO2 22 07/21/2022   GLUCOSE 105 (H) 07/21/2022   BUN 19 07/21/2022   CREATININE 0.82 07/21/2022   BILITOT 0.5 07/21/2022   ALKPHOS 72 03/19/2021   AST 24 07/21/2022   ALT 26 07/21/2022   PROT 6.6 07/21/2022    ALBUMIN 4.9 03/19/2021   CALCIUM 9.7 07/21/2022   GFRAA 110 07/07/2020    Speciality Comments: No specialty comments available.  Procedures:  No procedures performed Allergies: Daypro [oxaprozin] and Morphine and codeine   Assessment / Plan:     Visit Diagnoses: Primary osteoarthritis of both hands-he continues to have pain and stiffness in his bilateral hands.  He has bilateral CMC PIP and DIP thickening with mild synovitis.  Joint protection muscle strengthening was discussed.  He has been taking Celebrex 200 mg twice daily which has been helping with the pain.  He states he is unable to cut back on the Celebrex.  Primary osteoarthritis of both knees-he continues to have discomfort in his knee joints.  He has been wearing braces on  his knees.  He does not want to have knee replacement at this time.  DDD (degenerative disc disease), cervical - S/p fusion: He has limited range of motion of the cervical spine without much discomfort.  He takes intermittently gabapentin and Flexeril when the symptoms get worse.  Spondylosis of lumbar spine -he continues to have lower back pain.  Patient takes Flexeril sparingly to manage his symptoms.  He remains on gabapentin 200 mg at bedtime.  Medication monitoring encounter - Celebrex 200 mg 1 capsule by mouth twice daily. -He had labs in June which were within normal limits.  Will recheck labs today.  Plan: CBC with Differential/Platelet, COMPLETE METABOLIC PANEL WITH GFR.  Patient plans to get her lab at an outside [place] lab.  Neuralgia -he has intermittent neurologist.  He remains on gabapentin 200 mg at bedtime.  Type 2 diabetes mellitus without complication, without long-term current use of insulin (HCC)  History of gastroesophageal reflux (GERD)  Essential hypertension  History of hypothyroidism  History of environmental allergies  Orders: Orders Placed This Encounter  Procedures   CBC with Differential/Platelet   COMPLETE METABOLIC  PANEL WITH GFR   No orders of the defined types were placed in this encounter.    Follow-Up Instructions: Return in about 6 months (around 03/27/2024) for Osteoarthritis.   Pollyann Savoy, MD  Note - This record has been created using Animal nutritionist.  Chart creation errors have been sought, but may not always  have been located. Such creation errors do not reflect on  the standard of medical care.

## 2023-09-27 ENCOUNTER — Ambulatory Visit: Payer: Worker's Compensation | Attending: Rheumatology | Admitting: Rheumatology

## 2023-09-27 ENCOUNTER — Encounter: Payer: Self-pay | Admitting: Rheumatology

## 2023-09-27 VITALS — BP 121/78 | HR 91 | Resp 18 | Ht 75.0 in | Wt 279.6 lb

## 2023-09-27 DIAGNOSIS — M47816 Spondylosis without myelopathy or radiculopathy, lumbar region: Secondary | ICD-10-CM

## 2023-09-27 DIAGNOSIS — M19041 Primary osteoarthritis, right hand: Secondary | ICD-10-CM | POA: Diagnosis not present

## 2023-09-27 DIAGNOSIS — Z8719 Personal history of other diseases of the digestive system: Secondary | ICD-10-CM

## 2023-09-27 DIAGNOSIS — Z5181 Encounter for therapeutic drug level monitoring: Secondary | ICD-10-CM

## 2023-09-27 DIAGNOSIS — M170 Bilateral primary osteoarthritis of knee: Secondary | ICD-10-CM | POA: Diagnosis not present

## 2023-09-27 DIAGNOSIS — E119 Type 2 diabetes mellitus without complications: Secondary | ICD-10-CM

## 2023-09-27 DIAGNOSIS — I10 Essential (primary) hypertension: Secondary | ICD-10-CM

## 2023-09-27 DIAGNOSIS — Z8639 Personal history of other endocrine, nutritional and metabolic disease: Secondary | ICD-10-CM

## 2023-09-27 DIAGNOSIS — M19042 Primary osteoarthritis, left hand: Secondary | ICD-10-CM

## 2023-09-27 DIAGNOSIS — M792 Neuralgia and neuritis, unspecified: Secondary | ICD-10-CM

## 2023-09-27 DIAGNOSIS — M5030 Other cervical disc degeneration, unspecified cervical region: Secondary | ICD-10-CM

## 2023-09-27 DIAGNOSIS — Z9109 Other allergy status, other than to drugs and biological substances: Secondary | ICD-10-CM

## 2023-10-02 ENCOUNTER — Other Ambulatory Visit: Payer: Self-pay | Admitting: Physician Assistant

## 2023-10-02 DIAGNOSIS — Z79899 Other long term (current) drug therapy: Secondary | ICD-10-CM

## 2023-10-03 NOTE — Telephone Encounter (Signed)
Last Fill: 07/04/2023  Labs: 04/22/2023 Glucose 113 I called patient, patient will have labs drawn this week.  Next Visit: 03/27/2024  Last Visit: 09/27/2023  DX: Primary osteoarthritis of both hands   Current Dose per office note 09/27/2023: Celebrex 200 mg 1 capsule by mouth twice daily. -   Okay to refill Celebrex?

## 2023-10-05 LAB — COMPLETE METABOLIC PANEL WITH GFR
AG Ratio: 1.8 (calc) (ref 1.0–2.5)
ALT: 22 U/L (ref 9–46)
AST: 21 U/L (ref 10–35)
Albumin: 4.2 g/dL (ref 3.6–5.1)
Alkaline phosphatase (APISO): 63 U/L (ref 35–144)
BUN: 19 mg/dL (ref 7–25)
CO2: 24 mmol/L (ref 20–32)
Calcium: 9.5 mg/dL (ref 8.6–10.3)
Chloride: 108 mmol/L (ref 98–110)
Creat: 0.89 mg/dL (ref 0.70–1.35)
Globulin: 2.3 g/dL (ref 1.9–3.7)
Glucose, Bld: 110 mg/dL — ABNORMAL HIGH (ref 65–99)
Potassium: 4.4 mmol/L (ref 3.5–5.3)
Sodium: 139 mmol/L (ref 135–146)
Total Bilirubin: 0.4 mg/dL (ref 0.2–1.2)
Total Protein: 6.5 g/dL (ref 6.1–8.1)
eGFR: 96 mL/min/{1.73_m2} (ref >=60)

## 2023-10-05 LAB — CBC WITH DIFFERENTIAL/PLATELET
Absolute Lymphocytes: 1968 {cells}/uL (ref 850–3900)
Absolute Monocytes: 624 {cells}/uL (ref 200–950)
Basophils Absolute: 48 {cells}/uL (ref 0–200)
Basophils Relative: 0.8 %
Eosinophils Absolute: 90 {cells}/uL (ref 15–500)
Eosinophils Relative: 1.5 %
HCT: 45.4 % (ref 38.5–50.0)
Hemoglobin: 14.4 g/dL (ref 13.2–17.1)
MCH: 28.6 pg (ref 27.0–33.0)
MCHC: 31.7 g/dL — ABNORMAL LOW (ref 32.0–36.0)
MCV: 90.3 fL (ref 80.0–100.0)
MPV: 12.1 fL (ref 7.5–12.5)
Monocytes Relative: 10.4 %
Neutro Abs: 3270 {cells}/uL (ref 1500–7800)
Neutrophils Relative %: 54.5 %
Platelets: 244 10*3/uL (ref 140–400)
RBC: 5.03 10*6/uL (ref 4.20–5.80)
RDW: 11.8 % (ref 11.0–15.0)
Total Lymphocyte: 32.8 %
WBC: 6 10*3/uL (ref 3.8–10.8)

## 2023-10-05 NOTE — Progress Notes (Signed)
CBC and CMP are stable.

## 2023-10-11 ENCOUNTER — Other Ambulatory Visit (HOSPITAL_COMMUNITY): Payer: Self-pay | Admitting: Internal Medicine

## 2023-10-11 DIAGNOSIS — N390 Urinary tract infection, site not specified: Secondary | ICD-10-CM

## 2023-10-19 ENCOUNTER — Ambulatory Visit (HOSPITAL_COMMUNITY)
Admission: RE | Admit: 2023-10-19 | Discharge: 2023-10-19 | Disposition: A | Discharge status: Home / Self Care | Payer: BC Managed Care – PPO | Source: Ambulatory Visit | Attending: Internal Medicine | Admitting: Internal Medicine

## 2023-10-19 DIAGNOSIS — N390 Urinary tract infection, site not specified: Secondary | ICD-10-CM | POA: Insufficient documentation

## 2023-12-05 ENCOUNTER — Other Ambulatory Visit: Payer: Self-pay | Admitting: *Deleted

## 2023-12-05 MED ORDER — GABAPENTIN 100 MG PO CAPS: 200.0000 mg | ORAL_CAPSULE | Freq: Every day | ORAL | 2 refills | Status: DC

## 2023-12-05 NOTE — Telephone Encounter (Signed)
 Refill request received via fax from [facility] [address]  for Gabapentin   Last Fill: 09/05/2023  Next Visit: 03/27/2024  Last Visit: 09/27/2023  Dx: Neuralgia   Current Dose per office note on 09/27/2023: gabapentin  200 mg at bedtime.   Okay to refill Gabapentin ?

## 2024-01-02 ENCOUNTER — Other Ambulatory Visit: Payer: Self-pay | Admitting: *Deleted

## 2024-01-02 DIAGNOSIS — Z79899 Other long term (current) drug therapy: Secondary | ICD-10-CM

## 2024-01-02 MED ORDER — CELECOXIB 200 MG PO CAPS: ORAL_CAPSULE | ORAL | 2 refills | Status: DC

## 2024-01-02 NOTE — Telephone Encounter (Signed)
Refill request received via fax from [facility] [address]   for Celebrex   Last Fill: 10/03/2023  Labs: 10/04/2023 CBC and CMP are stable.   Next Visit: 03/27/2024  Last Visit: 09/27/2023  DX: Primary osteoarthritis of both hands   Current Dose per office note 09/27/2023: Celebrex 200 mg 1 capsule by mouth twice daily   Okay to refill Celebrex?

## 2024-03-05 ENCOUNTER — Other Ambulatory Visit: Payer: Self-pay | Admitting: Physician Assistant

## 2024-03-05 NOTE — Telephone Encounter (Signed)
 Last Fill: 12/05/2023  Next Visit: 03/27/2024  Last Visit: 09/27/2023  Dx: Neuralgia   Current Dose per office note on 09/27/2023: gabapentin 200 mg at bedtime.   Okay to refill Cymbalta?

## 2024-03-14 NOTE — Progress Notes (Signed)
 Office Visit Note  Patient: [name] [name]             Date of Birth: 1961-07-11           MRN: [id number]             PCP: Artemisa Bile, MD Referring: Artemisa Bile, MD Visit Date: 03/27/2024 Occupation: @GUAROCC @  Subjective:   increased back pain    History of Present Illness: [name] [name] is a 63 y.o. male with osteoarthritis and degenerative disc disease.  He returns today after his last visit in October 2024.  He states he been having increased pain and discomfort in his lower back.  He was evaluated by neurosurgery in the past.  He states injections were recommended but he prefer not to have injections.  He also has discomfort in his hands and wrist.  He has shoulder joint pain if he is on computer for a long time.  He has off-and-on pain in his knee joints depends on the activity.  He continues to state Flexeril  and gabapentin  for the lower back pain.    Activities of Daily Living:  Patient reports morning stiffness for all day. Patient Reports nocturnal pain.  Difficulty dressing/grooming: Denies Difficulty climbing stairs: Reports Difficulty getting out of chair: Reports Difficulty using hands for taps, buttons, cutlery, and/or writing: Denies  Review of Systems  Constitutional:  Negative for fatigue.  HENT:  Negative for mouth sores and mouth dryness.   Eyes:  Positive for dryness.  Respiratory:  Negative for shortness of breath and difficulty breathing.   Cardiovascular:  Negative for chest pain and palpitations.  Gastrointestinal:  Negative for blood in stool, constipation and diarrhea.  Endocrine: Negative for increased urination.  Genitourinary:  Negative for involuntary urination.  Musculoskeletal:  Positive for joint pain, gait problem, joint pain, joint swelling, myalgias, muscle weakness, morning stiffness, muscle tenderness and myalgias.  Skin:  Negative for color change, rash, hair loss and sensitivity to sunlight.  Allergic/Immunologic: Negative for  susceptible to infections.  Neurological:  Positive for headaches. Negative for dizziness.  Hematological:  Negative for swollen glands.  Psychiatric/Behavioral:  Negative for depressed mood and sleep disturbance. The patient is not nervous/anxious.     PMFS History:  Patient Active Problem List   Diagnosis Date Noted   History of environmental allergies 12/28/2016   Type 2 diabetes mellitus without complication, without long-term current use of insulin (HCC) 12/28/2016   Primary osteoarthritis of both hands 12/25/2016   Gastroesophageal reflux disease without esophagitis 12/25/2016   Neuralgia 12/25/2016   Primary osteoarthritis of both knees 12/24/2016   Spondylosis of lumbar region without myelopathy or radiculopathy 12/24/2016   DJD (degenerative joint disease), cervical 12/24/2016   Obesity 10/08/2013   Arthritis of both knees 10/08/2013   Low back pain 10/08/2013   HTN (hypertension) 10/08/2013   Hypothyroidism 10/08/2013   Constipation 10/04/2012    Past Medical History:  Diagnosis Date   Arthritis    Chronic constipation    Constipation    Hypertension    Hyperthyroidism     Family History  Problem Relation Age of Onset   Lung disease Sister    Past Surgical History:  Procedure Laterality Date   ABDOMINAL HERNIA REPAIR     Patient states that it was a double hernia surgery   FOOT SURGERY     HIATAL HERNIA REPAIR     knee surgeries     neck surgeries     UPPER GASTROINTESTINAL ENDOSCOPY  1980's  Social History   Social History Narrative   Not on file   Immunization History  Administered Date(s) Administered   Moderna Sars-Covid-2 Vaccination 02/04/2020, 03/04/2020, 11/11/2020     Objective: Vital Signs: BP 103/70 (BP Location: Left Arm, Patient Position: Sitting, Cuff Size: Large)   Pulse 72   Resp 17   Ht 6\' 2"  (1.88 m)   Wt 275 lb 12.8 oz (125.1 kg)   BMI 35.41 kg/m    Physical Exam Vitals and nursing note reviewed.  Constitutional:       Appearance: He is well-developed.  HENT:     Head: Normocephalic and atraumatic.  Eyes:     Conjunctiva/sclera: Conjunctivae normal.     Pupils: Pupils are equal, round, and reactive to light.  Cardiovascular:     Rate and Rhythm: Normal rate and regular rhythm.     Heart sounds: Normal heart sounds.  Pulmonary:     Effort: Pulmonary effort is normal.     Breath sounds: Normal breath sounds.  Abdominal:     General: Bowel sounds are normal.     Palpations: Abdomen is soft.  Musculoskeletal:     Cervical back: Normal range of motion and neck supple.  Skin:    General: Skin is warm and dry.     Capillary Refill: Capillary refill takes less than 2 seconds.  Neurological:     Mental Status: He is alert and oriented to person, place, and time.  Psychiatric:        Behavior: Behavior normal.      Musculoskeletal Exam: He has some limitation with lateral rotation of the cervical spine.  He had limited painful range of motion of the lumbar spine.  Shoulders, elbows, wrist, MCPs PIPs and DIPs with good range of motion.  Bilateral CMC PIP and DIP thickening with no synovitis was noted.  Hip joints and knee joints with good range of motion without any warmth swelling or effusion.  There was no tenderness over ankles or MTPs.  CDAI Exam: CDAI Score: -- Patient Global: --; Provider Global: -- Swollen: --; Tender: -- Joint Exam 03/27/2024   No joint exam has been documented for this visit   There is currently no information documented on the homunculus. Go to the Rheumatology activity and complete the homunculus joint exam.  Investigation: No additional findings.  Imaging: No results found.  Recent Labs: Lab Results  Component Value Date   WBC 6.2 03/22/2024   HGB 14.8 03/22/2024   PLT 207 03/22/2024   NA 141 03/22/2024   K 4.2 03/22/2024   CL 108 03/22/2024   CO2 23 03/22/2024   GLUCOSE 117 (H) 03/22/2024   BUN 19 03/22/2024   CREATININE 0.78 03/22/2024   BILITOT 0.7  03/22/2024   ALKPHOS 72 03/19/2021   AST 21 03/22/2024   ALT 27 03/22/2024   PROT 6.5 03/22/2024   ALBUMIN 4.9 03/19/2021   CALCIUM 9.5 03/22/2024   GFRAA 110 07/07/2020    Speciality Comments: No specialty comments available.  Procedures:  No procedures performed Allergies: Daypro [oxaprozin] and Morphine and codeine   Assessment / Plan:     Visit Diagnoses: Primary osteoarthritis of both hands-he continues to have pain and discomfort in his bilateral hands.  Bilateral CMC PIP and DIP thickening with no synovitis was noted.  He states Celebrex  has been helpful and he has been taking Celebrex  200 mg p.o. twice daily without any interruption.  Side effects of Celebrex  were reviewed including the increased risk of GI bleeding,  hepatic and renal toxicity.  Recent labs obtained in April 2025 were within normal limits.  Primary osteoarthritis of both knees-his chronic discomfort in his knee joints.  No warmth swelling or effusion was noted.  DDD (degenerative disc disease), cervical - S/p fusion: He has limited range of motion of his cervical spine with chronic discomfort.  He states Flexeril  is helpful.  Spondylosis of lumbar spine-patient is he was evaluated by neurosurgery in the past and was advised cortisone injections but he declined.  He continues to have lower back pain.  He states gabapentin  and Flexeril  are helpful.  He requested Flexeril  refill which was sent.  Medication monitoring encounter - Celebrex  200 mg 1 capsule by mouth twice daily. - Plan: CBC with Differential/Platelet, Comprehensive metabolic panel with GFR in 6 months.  Neuralgia - gabapentin  200 mg at bedtime.  Other medical problems are listed as follows:  Type 2 diabetes mellitus without complication, without long-term current use of insulin (HCC)  Essential hypertension  History of gastroesophageal reflux (GERD)  History of hypothyroidism  History of environmental allergies  Orders: Orders Placed This  Encounter  Procedures   CBC with Differential/Platelet   Comprehensive metabolic panel with GFR   Meds ordered this encounter  Medications   cyclobenzaprine  (FLEXERIL ) 10 MG tablet    Sig: Take 1 tablet (10 mg total) by mouth at bedtime as needed for muscle spasms.    Dispense:  30 tablet    Refill:  0     Follow-Up Instructions: Return in about 6 months (around 09/26/2024) for Osteoarthritis.   Nicholas Bari, MD  Note - This record has been created using Animal nutritionist.  Chart creation errors have been sought, but may not always  have been located. Such creation errors do not reflect on  the standard of medical care.

## 2024-03-19 ENCOUNTER — Other Ambulatory Visit: Payer: Self-pay | Admitting: *Deleted

## 2024-03-19 DIAGNOSIS — Z5181 Encounter for therapeutic drug level monitoring: Secondary | ICD-10-CM

## 2024-03-23 LAB — CBC WITH DIFFERENTIAL/PLATELET
Absolute Lymphocytes: 1798 {cells}/uL (ref 850–3900)
Absolute Monocytes: 521 {cells}/uL (ref 200–950)
Basophils Absolute: 50 {cells}/uL (ref 0–200)
Basophils Relative: 0.8 %
Eosinophils Absolute: 62 {cells}/uL (ref 15–500)
Eosinophils Relative: 1 %
HCT: 45.8 % (ref 38.5–50.0)
Hemoglobin: 14.8 g/dL (ref 13.2–17.1)
MCH: 29.2 pg (ref 27.0–33.0)
MCHC: 32.3 g/dL (ref 32.0–36.0)
MCV: 90.3 fL (ref 80.0–100.0)
MPV: 12.1 fL (ref 7.5–12.5)
Monocytes Relative: 8.4 %
Neutro Abs: 3770 {cells}/uL (ref 1500–7800)
Neutrophils Relative %: 60.8 %
Platelets: 207 10*3/uL (ref 140–400)
RBC: 5.07 10*6/uL (ref 4.20–5.80)
RDW: 12.4 % (ref 11.0–15.0)
Total Lymphocyte: 29 %
WBC: 6.2 10*3/uL (ref 3.8–10.8)

## 2024-03-23 LAB — COMPREHENSIVE METABOLIC PANEL WITH GFR
AG Ratio: 2.3 (calc) (ref 1.0–2.5)
ALT: 27 U/L (ref 9–46)
AST: 21 U/L (ref 10–35)
Albumin: 4.5 g/dL (ref 3.6–5.1)
Alkaline phosphatase (APISO): 59 U/L (ref 35–144)
BUN: 19 mg/dL (ref 7–25)
CO2: 23 mmol/L (ref 20–32)
Calcium: 9.5 mg/dL (ref 8.6–10.3)
Chloride: 108 mmol/L (ref 98–110)
Creat: 0.78 mg/dL (ref 0.70–1.35)
Globulin: 2 g/dL (ref 1.9–3.7)
Glucose, Bld: 117 mg/dL — ABNORMAL HIGH (ref 65–99)
Potassium: 4.2 mmol/L (ref 3.5–5.3)
Sodium: 141 mmol/L (ref 135–146)
Total Bilirubin: 0.7 mg/dL (ref 0.2–1.2)
Total Protein: 6.5 g/dL (ref 6.1–8.1)
eGFR: 100 mL/min/{1.73_m2} (ref >=60)

## 2024-03-23 NOTE — Progress Notes (Signed)
 CBC and CMP normal

## 2024-03-27 ENCOUNTER — Encounter: Payer: Self-pay | Admitting: Rheumatology

## 2024-03-27 ENCOUNTER — Ambulatory Visit: Payer: BC Managed Care – PPO | Attending: Rheumatology | Admitting: Rheumatology

## 2024-03-27 VITALS — BP 103/70 | HR 72 | Resp 17 | Ht 74.0 in | Wt 275.8 lb

## 2024-03-27 DIAGNOSIS — M5030 Other cervical disc degeneration, unspecified cervical region: Secondary | ICD-10-CM | POA: Diagnosis not present

## 2024-03-27 DIAGNOSIS — M47816 Spondylosis without myelopathy or radiculopathy, lumbar region: Secondary | ICD-10-CM

## 2024-03-27 DIAGNOSIS — M19041 Primary osteoarthritis, right hand: Secondary | ICD-10-CM

## 2024-03-27 DIAGNOSIS — E119 Type 2 diabetes mellitus without complications: Secondary | ICD-10-CM

## 2024-03-27 DIAGNOSIS — Z8639 Personal history of other endocrine, nutritional and metabolic disease: Secondary | ICD-10-CM

## 2024-03-27 DIAGNOSIS — M792 Neuralgia and neuritis, unspecified: Secondary | ICD-10-CM

## 2024-03-27 DIAGNOSIS — I10 Essential (primary) hypertension: Secondary | ICD-10-CM

## 2024-03-27 DIAGNOSIS — Z5181 Encounter for therapeutic drug level monitoring: Secondary | ICD-10-CM

## 2024-03-27 DIAGNOSIS — M170 Bilateral primary osteoarthritis of knee: Secondary | ICD-10-CM | POA: Diagnosis not present

## 2024-03-27 DIAGNOSIS — Z9109 Other allergy status, other than to drugs and biological substances: Secondary | ICD-10-CM

## 2024-03-27 DIAGNOSIS — Z8719 Personal history of other diseases of the digestive system: Secondary | ICD-10-CM

## 2024-03-27 DIAGNOSIS — M19042 Primary osteoarthritis, left hand: Secondary | ICD-10-CM

## 2024-03-27 MED ORDER — CYCLOBENZAPRINE HCL 10 MG PO TABS: 10.0000 mg | ORAL_TABLET | Freq: Every evening | ORAL | 0 refills | Status: DC | PRN

## 2024-04-05 ENCOUNTER — Other Ambulatory Visit: Payer: Self-pay | Admitting: *Deleted

## 2024-04-05 DIAGNOSIS — Z79899 Other long term (current) drug therapy: Secondary | ICD-10-CM

## 2024-04-05 MED ORDER — CELECOXIB 200 MG PO CAPS: ORAL_CAPSULE | ORAL | 2 refills | Status: DC

## 2024-04-05 NOTE — Telephone Encounter (Signed)
 Refill request received via fax from [facility] [place] for Celebrex    Last Fill: 01/02/2024  Labs: 03/22/2024 CBC and CMP normal.   Next Visit: 09/26/2024  Last Visit: 03/27/2024  DX:  Primary osteoarthritis of both hands   Current Dose per office note 03/27/2024: Celebrex  200 mg 1 capsule by mouth twice daily.   Okay to refill Celebrex ?

## 2024-04-11 ENCOUNTER — Telehealth: Payer: Self-pay | Admitting: Rheumatology

## 2024-04-11 NOTE — Telephone Encounter (Signed)
 Patient called stating his appointment with Dr. Alvira Josephs on 03/27/24 was billed incorrectly.  Patient states it should have been filed through his [employer], but instead was billed through [employer].  Patient states he called [facility] billing to request they refile through worker's compensation claim instead of his personal health insurance.  Patient states [facility] told him that Dr. Jory Ng office would have to refile the claim.  Patient requested a return call.

## 2024-06-03 ENCOUNTER — Other Ambulatory Visit: Payer: Self-pay | Admitting: Rheumatology

## 2024-06-04 NOTE — Telephone Encounter (Signed)
 Last Fill: 03/05/2024  Next Visit: 09/26/2024  Last Visit: 03/27/2024  Dx: Neuralgia   Current Dose per office note on 03/27/2024: gabapentin  200 mg at bedtime   Okay to refill Gabapentin ?

## 2024-06-05 ENCOUNTER — Other Ambulatory Visit: Payer: Self-pay | Admitting: Rheumatology

## 2024-06-05 NOTE — Telephone Encounter (Signed)
 Last Fill: 03/27/2024   Next Visit: 09/26/2024  Last Visit: 03/27/2024  Dx: Spondylosis of lumbar spine   Current Dose per office note on 03/27/2024: dose note discussed.   Okay to refill Flexeril ?

## 2024-08-06 ENCOUNTER — Telehealth: Payer: Self-pay

## 2024-08-06 ENCOUNTER — Other Ambulatory Visit: Payer: Self-pay | Admitting: Rheumatology

## 2024-08-06 DIAGNOSIS — Z79899 Other long term (current) drug therapy: Secondary | ICD-10-CM

## 2024-08-06 NOTE — Telephone Encounter (Signed)
 Patient called the office stating that the pharmacy called him advising that his prescription was denied, advised that was not accurate the prescription was signed this morning at 9:30AM.

## 2024-08-06 NOTE — Telephone Encounter (Signed)
 Contacted pharmacy to check an see what is going on with the prescription. They advised they did have the prescription was there an it would be ready tomorrow at 1PM

## 2024-08-06 NOTE — Telephone Encounter (Signed)
 Last Fill: 04/05/2024  Labs: 03/23/2023 CBC and CMP normal.   Next Visit: 09/26/2024  Last Visit: 03/27/2024  DX: Primary osteoarthritis of both hands   Current Dose per office note 03/27/2024: Celebrex  200 mg p.o. twice daily   Okay to refill Celebrex ?

## 2024-09-05 ENCOUNTER — Other Ambulatory Visit: Payer: Self-pay | Admitting: Rheumatology

## 2024-09-05 MED ORDER — GABAPENTIN 100 MG PO CAPS: 200.0000 mg | ORAL_CAPSULE | Freq: Every day | ORAL | 2 refills | Status: DC

## 2024-09-05 NOTE — Addendum Note (Signed)
 Addended by: CENA ALFONSO CROME on: 09/05/2024 02:06 PM   Modules accepted: Orders

## 2024-09-05 NOTE — Telephone Encounter (Signed)
 Patient called the office back, advised that his prescriptions were sent to [facility]. Verbalized understanding

## 2024-09-05 NOTE — Telephone Encounter (Signed)
 Patient contacted the office and requested his prescription to go to [facility] instead of [facility]. Resent prescription to requested pharmacy and cancelled prescription at [facility]. Left message to advise patient

## 2024-09-05 NOTE — Telephone Encounter (Signed)
 Last Fill: 06/04/2024  Next Visit: 09/26/2024  Last Visit: 03/27/2024  DX: Neuralgia   Current Dose per office note on 03/27/2024: Neuralgia   Okay to refill gabapentin ?

## 2024-09-12 NOTE — Progress Notes (Unsigned)
 Office Visit Note  Patient: [name] [name]             Date of Birth: Feb 29, 1961           MRN: [id number]             PCP: Sheryle Carwin, MD Referring: Sheryle Carwin, MD Visit Date: 09/26/2024 Occupation: Data Unavailable  Subjective:  Pain in multiple joints   History of Present Illness: [name] [name] is a 63 y.o. male with history of osteoarthritis.  He is taking celebrex  200 mg 1 capsule twice daily.  Patient continues to have chronic pain involving multiple joints.  His symptoms have been most severe involving his lower back, both knees, and both wrist joints.  He was recently weed whacking which exacerbated the discomfort in his lower back.  He recently took a prednisone taper which provided temporary relief but his symptoms have started to recur.  He is considering proceeding with surgical intervention for his back if his symptoms persist or worsen. He remains on gabapentin  200 mg at bedtime.  He will be following up with neurology to discuss the memory changes and brain fog he has been experiencing.    Activities of Daily Living:  Patient reports morning stiffness for the time varies.   Patient Reports nocturnal pain.  Difficulty dressing/grooming: Denies Difficulty climbing stairs: Reports Difficulty getting out of chair: Reports Difficulty using hands for taps, buttons, cutlery, and/or writing: Reports  Review of Systems  Constitutional:  Negative for fatigue.  HENT:  Negative for mouth sores and mouth dryness.   Eyes:  Negative for dryness.  Respiratory:  Negative for shortness of breath.   Cardiovascular:  Negative for chest pain and palpitations.  Gastrointestinal:  Positive for constipation. Negative for blood in stool and diarrhea.  Endocrine: Negative for increased urination.  Genitourinary:  Negative for involuntary urination.  Musculoskeletal:  Positive for joint pain, joint pain, joint swelling, myalgias, muscle weakness, morning stiffness, muscle tenderness and  myalgias. Negative for gait problem.  Skin:  Negative for color change, rash, hair loss and sensitivity to sunlight.  Allergic/Immunologic: Negative for susceptible to infections.  Neurological:  Positive for headaches. Negative for dizziness.  Hematological:  Negative for swollen glands.  Psychiatric/Behavioral:  Negative for depressed mood and sleep disturbance. The patient is not nervous/anxious.     PMFS History:  Patient Active Problem List   Diagnosis Date Noted   History of environmental allergies 12/28/2016   Type 2 diabetes mellitus without complication, without long-term current use of insulin (HCC) 12/28/2016   Primary osteoarthritis of both hands 12/25/2016   Gastroesophageal reflux disease without esophagitis 12/25/2016   Neuralgia 12/25/2016   Primary osteoarthritis of both knees 12/24/2016   Spondylosis of lumbar region without myelopathy or radiculopathy 12/24/2016   DJD (degenerative joint disease), cervical 12/24/2016   Obesity 10/08/2013   Arthritis of both knees 10/08/2013   Low back pain 10/08/2013   HTN (hypertension) 10/08/2013   Hypothyroidism 10/08/2013   Constipation 10/04/2012    Past Medical History:  Diagnosis Date   Arthritis    Chronic constipation    Constipation    Hypertension    Hyperthyroidism     Family History  Problem Relation Age of Onset   Lung disease Sister    Past Surgical History:  Procedure Laterality Date   ABDOMINAL HERNIA REPAIR     Patient states that it was a double hernia surgery   FOOT SURGERY     HIATAL HERNIA REPAIR  knee surgeries     neck surgeries     UPPER GASTROINTESTINAL ENDOSCOPY  1980's   Social History   Tobacco Use   Smoking status: Former    Current packs/day: 0.00    Average packs/day: 1.5 packs/day for 16.0 years (24.0 ttl pk-yrs)    Types: Cigarettes    Start date: 10/08/1976    Quit date: 10/08/1992    Years since quitting: 31.9    Passive exposure: Never   Smokeless tobacco: Never   Vaping Use   Vaping status: Never Used  Substance Use Topics   Alcohol use: No   Drug use: No   Social History   Social History Narrative   Not on file     Immunization History  Administered Date(s) Administered   Moderna Sars-Covid-2 Vaccination 02/04/2020, 03/04/2020, 11/11/2020     Objective: Vital Signs: BP 122/79   Pulse 87   Temp 98.5 F (36.9 C)   Resp 14   Ht 6' 2 (1.88 m)   Wt 276 lb 6.4 oz (125.4 kg)   BMI 35.49 kg/m    Physical Exam Vitals and nursing note reviewed.  Constitutional:      Appearance: He is well-developed.  HENT:     Head: Normocephalic and atraumatic.  Eyes:     Conjunctiva/sclera: Conjunctivae normal.     Pupils: Pupils are equal, round, and reactive to light.  Cardiovascular:     Rate and Rhythm: Normal rate and regular rhythm.     Heart sounds: Normal heart sounds.  Pulmonary:     Effort: Pulmonary effort is normal.     Breath sounds: Normal breath sounds.  Abdominal:     General: Bowel sounds are normal.     Palpations: Abdomen is soft.  Musculoskeletal:     Cervical back: Normal range of motion and neck supple.  Skin:    General: Skin is warm and dry.     Capillary Refill: Capillary refill takes less than 2 seconds.  Neurological:     Mental Status: He is alert and oriented to person, place, and time.  Psychiatric:        Behavior: Behavior normal.      Musculoskeletal Exam: Patient remained seated during examination today.  C-spine has limited range of motion without rotation.  Thoracic kyphosis noted.  Painful limited mobility of the lumbar spine.  Shoulder joints have good range of motion.  Elbow joints have good range of motion with no tenderness or inflammation along the joint line.  Slightly limited flexion and extension of both wrist joints.  CMC joint prominence and tenderness bilaterally.  No tenderness or synovitis over MCP joints.  Complete fist formation bilaterally.  PIP and DIP thickening consistent with  osteoarthritis of both hands.  Hip joints difficult to assess in seated position.  Painful range of motion of both knees.  Knee braces noted on both knees.  No tenderness or swelling of ankle joints.   CDAI Exam: CDAI Score: -- Patient Global: --; Provider Global: -- Swollen: --; Tender: -- Joint Exam 09/26/2024   No joint exam has been documented for this visit   There is currently no information documented on the homunculus. Go to the Rheumatology activity and complete the homunculus joint exam.  Investigation: No additional findings.  Imaging: No results found.  Recent Labs: Lab Results  Component Value Date   WBC 6.2 03/22/2024   HGB 14.8 03/22/2024   PLT 207 03/22/2024   NA 141 03/22/2024   K 4.2 03/22/2024  CL 108 03/22/2024   CO2 23 03/22/2024   GLUCOSE 117 (H) 03/22/2024   BUN 19 03/22/2024   CREATININE 0.78 03/22/2024   BILITOT 0.7 03/22/2024   ALKPHOS 72 03/19/2021   AST 21 03/22/2024   ALT 27 03/22/2024   PROT 6.5 03/22/2024   ALBUMIN 4.9 03/19/2021   CALCIUM 9.5 03/22/2024   GFRAA 110 07/07/2020    Speciality Comments: No specialty comments available.  Procedures:  No procedures performed Allergies: Daypro [oxaprozin] and Morphine and codeine   Assessment / Plan:     Visit Diagnoses: Primary osteoarthritis of both hands: He has PIP and DIP thickening consistent osteoarthritis of both hands.  CMC joint prominence and tenderness bilaterally.  He experiences intermittent discomfort in both wrist and both CMC joints especially with repetitive or overuse activities.  Discussed the use of arthritis compression gloves.  He takes Celebrex  200 mg 1 capsule twice daily for symptomatic relief.  Discussed the importance of joint protection and muscle strengthening.  Primary osteoarthritis of both knees: Patient continues to have chronic pain in both knee joints.  He uses knee joint braces bilaterally for support.  He remains on Celebrex  200 mg 1 capsule twice daily  for symptomatic relief.  Medication monitoring encounter - Celebrex  200 mg 1 capsule by mouth twice daily.  CBC and CMP updated on 03/22/24.  Orders for CBC and CMP released today--he plans on having updated lab work drawn tomorrow. - Plan: CBC with Differential/Platelet, Comprehensive metabolic panel with GFR  DDD (degenerative disc disease), cervical - S/p surgical fusion: C-spine has limited range of motion.  No symptoms of radiculopathy at this time.  Spondylosis of lumbar spine: Chronic pain.  Patient is considering proceeding with surgical intervention if his symptoms persist or worsen.  Neuralgia -He takes gabapentin  200 mg at bedtime. He will be following up with neurology to discuss the brain fog and memory changes he has been experiencing-discussed that this may be a side effect of gabapentin .  Other medical conditions are listed as follows:  Type 2 diabetes mellitus without complication, without long-term current use of insulin (HCC)  Essential hypertension: Blood pressure was 122/79 today.  History of gastroesophageal reflux (GERD)  History of hypothyroidism  History of environmental allergies   Orders: No orders of the defined types were placed in this encounter.  Meds ordered this encounter  Medications   celecoxib  (CELEBREX ) 200 MG capsule    Sig: TAKE 1 CAPSULE BY MOUTH TWICE DAILY WITH FOOD AS NEEDED    Dispense:  60 capsule    Refill:  2     Follow-Up Instructions: Return in about 6 months (around 03/27/2025) for Osteoarthritis.   Waddell CHRISTELLA Craze, PA-C  Note - This record has been created using Dragon software.  Chart creation errors have been sought, but may not always  have been located. Such creation errors do not reflect on  the standard of medical care.

## 2024-09-26 ENCOUNTER — Encounter: Payer: Self-pay | Admitting: Physician Assistant

## 2024-09-26 ENCOUNTER — Ambulatory Visit: Payer: PRIVATE HEALTH INSURANCE | Attending: Physician Assistant | Admitting: Physician Assistant

## 2024-09-26 VITALS — BP 122/79 | HR 87 | Temp 98.5°F | Resp 14 | Ht 74.0 in | Wt 276.4 lb

## 2024-09-26 DIAGNOSIS — M19041 Primary osteoarthritis, right hand: Secondary | ICD-10-CM | POA: Diagnosis not present

## 2024-09-26 DIAGNOSIS — M170 Bilateral primary osteoarthritis of knee: Secondary | ICD-10-CM | POA: Diagnosis not present

## 2024-09-26 DIAGNOSIS — M47816 Spondylosis without myelopathy or radiculopathy, lumbar region: Secondary | ICD-10-CM

## 2024-09-26 DIAGNOSIS — Z5181 Encounter for therapeutic drug level monitoring: Secondary | ICD-10-CM | POA: Diagnosis not present

## 2024-09-26 DIAGNOSIS — M5030 Other cervical disc degeneration, unspecified cervical region: Secondary | ICD-10-CM

## 2024-09-26 DIAGNOSIS — Z79899 Other long term (current) drug therapy: Secondary | ICD-10-CM

## 2024-09-26 DIAGNOSIS — M19042 Primary osteoarthritis, left hand: Secondary | ICD-10-CM

## 2024-09-26 DIAGNOSIS — M792 Neuralgia and neuritis, unspecified: Secondary | ICD-10-CM

## 2024-09-26 DIAGNOSIS — Z9109 Other allergy status, other than to drugs and biological substances: Secondary | ICD-10-CM

## 2024-09-26 DIAGNOSIS — E119 Type 2 diabetes mellitus without complications: Secondary | ICD-10-CM

## 2024-09-26 DIAGNOSIS — I10 Essential (primary) hypertension: Secondary | ICD-10-CM

## 2024-09-26 DIAGNOSIS — Z8719 Personal history of other diseases of the digestive system: Secondary | ICD-10-CM

## 2024-09-26 DIAGNOSIS — Z8639 Personal history of other endocrine, nutritional and metabolic disease: Secondary | ICD-10-CM

## 2024-09-26 MED ORDER — CELECOXIB 200 MG PO CAPS: ORAL_CAPSULE | ORAL | 2 refills | Status: AC

## 2024-09-29 LAB — COMPREHENSIVE METABOLIC PANEL WITH GFR
AG Ratio: 2 (calc) (ref 1.0–2.5)
ALT: 30 U/L (ref 9–46)
AST: 22 U/L (ref 10–35)
Albumin: 4.5 g/dL (ref 3.6–5.1)
Alkaline phosphatase (APISO): 62 U/L (ref 35–144)
BUN: 16 mg/dL (ref 7–25)
CO2: 23 mmol/L (ref 20–32)
Calcium: 9.6 mg/dL (ref 8.6–10.3)
Chloride: 105 mmol/L (ref 98–110)
Creat: 0.84 mg/dL (ref 0.70–1.35)
Globulin: 2.2 g/dL (ref 1.9–3.7)
Glucose, Bld: 220 mg/dL — ABNORMAL HIGH (ref 65–99)
Potassium: 4.5 mmol/L (ref 3.5–5.3)
Sodium: 139 mmol/L (ref 135–146)
Total Bilirubin: 0.6 mg/dL (ref 0.2–1.2)
Total Protein: 6.7 g/dL (ref 6.1–8.1)
eGFR: 97 mL/min/1.73m2 (ref >=60)

## 2024-09-29 LAB — CBC WITH DIFFERENTIAL/PLATELET
Absolute Lymphocytes: 2105 {cells}/uL (ref 850–3900)
Absolute Monocytes: 594 {cells}/uL (ref 200–950)
Basophils Absolute: 40 {cells}/uL (ref 0–200)
Basophils Relative: 0.6 %
Eosinophils Absolute: 40 {cells}/uL (ref 15–500)
Eosinophils Relative: 0.6 %
HCT: 47.6 % (ref 38.5–50.0)
Hemoglobin: 15.4 g/dL (ref 13.2–17.1)
MCH: 29.2 pg (ref 27.0–33.0)
MCHC: 32.4 g/dL (ref 32.0–36.0)
MCV: 90.2 fL (ref 80.0–100.0)
MPV: 12.7 fL — ABNORMAL HIGH (ref 7.5–12.5)
Monocytes Relative: 9 %
Neutro Abs: 3821 {cells}/uL (ref 1500–7800)
Neutrophils Relative %: 57.9 %
Platelets: 221 Thousand/uL (ref 140–400)
RBC: 5.28 Million/uL (ref 4.20–5.80)
RDW: 12.3 % (ref 11.0–15.0)
Total Lymphocyte: 31.9 %
WBC: 6.6 Thousand/uL (ref 3.8–10.8)

## 2024-09-30 ENCOUNTER — Ambulatory Visit: Payer: Self-pay | Admitting: Rheumatology

## 2024-09-30 NOTE — Progress Notes (Signed)
 CBC and CMP are stable except glucose is elevated at 220.  Please notify patient and forward results to his PCP.

## 2024-10-16 ENCOUNTER — Encounter: Payer: Self-pay | Admitting: Neurology

## 2024-10-16 ENCOUNTER — Ambulatory Visit: Payer: PRIVATE HEALTH INSURANCE | Admitting: Neurology

## 2024-10-16 VITALS — BP 148/76 | HR 88 | Ht 75.0 in | Wt 277.5 lb

## 2024-10-16 DIAGNOSIS — Z630 Problems in relationship with spouse or partner: Secondary | ICD-10-CM

## 2024-10-16 DIAGNOSIS — F419 Anxiety disorder, unspecified: Secondary | ICD-10-CM

## 2024-10-16 DIAGNOSIS — R419 Unspecified symptoms and signs involving cognitive functions and awareness: Secondary | ICD-10-CM | POA: Diagnosis not present

## 2024-10-16 DIAGNOSIS — F32A Depression, unspecified: Secondary | ICD-10-CM | POA: Diagnosis not present

## 2024-10-16 MED ORDER — DULOXETINE HCL 30 MG PO CPEP: 30.0000 mg | ORAL_CAPSULE | Freq: Every day | ORAL | 1 refills | Status: AC

## 2024-10-16 NOTE — Patient Instructions (Addendum)
 Continue current medications Start Cymbalta 30 mg daily Please call for updates Follow-up with PCP to discuss treatment option for spouse (since he is the same PCP for both patient and spouse) Encourage physical activities, mainly water exercise Return as needed

## 2024-10-16 NOTE — Progress Notes (Signed)
 [facility]  PATIENT: [name] [name] DOB: 1961-09-22  REQUESTING CLINICIAN: Sheryle Carwin, MD HISTORY FROM: Patient  REASON FOR VISIT: Memory loss    HISTORICAL  CHIEF COMPLAINT:  Chief Complaint  Patient presents with   RM12/MEMORY    Pt is here Alone. Pt states that he has had memory issues within the past 2 years that have increased.     HISTORY OF PRESENT ILLNESS:  Discussed the use of AI scribe software for clinical note transcription with the patient, who gave verbal consent to proceed.  [name] [name] is a 63 year old male with history of hypertension, hyperlipidemia, diabetes mellitus, hypothyroidism who presents with concerns about memory decline.  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 The decline has become more noticeable in the last couple of years, but he denies that it has impacted his professional performance. He has been taking 6000 mcg of B12 daily for a couple of months, which he feels has only slightly improved his memory. No significant memory issues are noted outside of work, and only his wife has expressed concerns about his memory.  He describes significant stress related to his wife's delusions (hallucinations, believing that he is cheating), which he believes may be contributing to his memory problems. This situation has been a source of considerable stress for him, affecting his blood pressure and overall well-being.  He denies any difficulty with daily activities such as cooking, cleaning, or driving, although he mentions physical limitations due to past injuries, including an ACL injury and lower back pain. He sleeps an average of five and a half hours per night using a CPAP machine. He sometimes experiences difficulty with word finding and denies any recent accident or being lost while driving  in familiar places.  Sometime he will forget to stop at the grocery store for example on his way home,, which he attributes to having a lot on his mind.  He has no family history of dementia and denies any history of traumatic brain injury, stroke, seizures, or depression. He recalls a possible concussion from a work-related accident in 1973 but reports no lasting effects. He is on thyroid medication and regularly checks his thyroid levels.    TBI: Concussion  Stroke:  no past history of stroke Seizures:  no past history of seizures Sleep: Sleep apnea  no history of sleep apnea.  Mood:  patient denies anxiety and depression Family history of Dementia: Denies  Functional status: independent in all ADLs and IADLs Patient lives with spouse. Cooking: no issues  Cleaning: no issues  Shopping: no issues  Bathing: no issues  Toileting: no issues  Driving: no issues  Bills: no issues  Medications: no issues  Ever left the stove on by accident?: denies Forget how to use items around the house?: denies Getting lost going to familiar places?: Denies  Forgetting loved ones names?: no Word finding difficulty?  Yes Sleep: Good with CPAP    OTHER MEDICAL CONDITIONS: Hypertension, hyperlipidemia, diabetes, hypothyroidism, anxiety/depression    REVIEW OF SYSTEMS: Full 14 system review of systems performed and negative with exception of: As noted in the HPI   ALLERGIES: Allergies  Allergen Reactions   Daypro [Oxaprozin] Other (See Comments)    Patient states that this medication caused bad abdominal cramping   Morphine And Codeine     HOME MEDICATIONS: Outpatient Medications Prior to Visit  Medication Sig Dispense Refill   ASPIRIN 81 PO Take 81 mg by mouth daily.     atorvastatin (LIPITOR) 20 MG tablet Take 20 mg by mouth daily.     Blood Glucose Monitoring Suppl (ONE TOUCH ULTRA 2) w/Device KIT      celecoxib  (CELEBREX ) 200 MG capsule TAKE 1 CAPSULE BY MOUTH TWICE DAILY WITH FOOD AS  NEEDED 60 capsule 2   Cyanocobalamin (VITAMIN B 12 PO) Take 6,000 mcg by mouth.     cyclobenzaprine  (FLEXERIL ) 10 MG tablet TAKE 1 TABLET(10 MG) BY MOUTH AT BEDTIME AS NEEDED FOR MUSCLE SPASMS 30 tablet 0   gabapentin  (NEURONTIN ) 100 MG capsule Take 2 capsules (200 mg total) by mouth at bedtime. 60 capsule 2   JARDIANCE 25 MG TABS tablet Take 25 mg by mouth daily.     Lancets (ONETOUCH DELICA PLUS LANCET33G) MISC      levothyroxine (SYNTHROID, LEVOTHROID) 112 MCG tablet Take 112 mcg by mouth daily before breakfast.     LINZESS  145 MCG CAPS capsule TAKE ONE CAPSULE BY MOUTH TWICE A DAY (Patient taking differently: Take 145 mcg by mouth as needed.) 60 capsule 2   losartan (COZAAR) 100 MG tablet Take 100 mg by mouth daily.     metFORMIN (GLUCOPHAGE) 500 MG tablet Take 500 mg by mouth 2 (two) times daily with a meal.     ondansetron (ZOFRAN) 4 MG tablet Take 4 mg by mouth every 6 (six) hours as needed.     ONE TOUCH ULTRA TEST test strip      Polyvinyl Alcohol-Povidone (REFRESH OP) Apply to eye.     TRULICITY 0.75 MG/0.5ML SOPN SMARTSIG:1 SUB-Q Once a Week     VOLTAREN  1 % GEL APPLY 2-4 GRAMS TO AFFECTED JOINTS UP TO 4 TIMES DAILY. 400 g 0   XIIDRA 5 % SOLN INSTILL 1 DROP INTO BOTH EYES TWICE A DAY  9   losartan (COZAAR) 50 MG tablet Take 50 mg by mouth at bedtime. (Patient not taking: Reported on 10/16/2024)     No facility-administered medications prior to visit.    PAST MEDICAL HISTORY: Past Medical History:  Diagnosis Date   Arthritis    Chronic constipation    Constipation    Hypertension    Hyperthyroidism     PAST SURGICAL HISTORY: Past Surgical History:  Procedure Laterality Date   ABDOMINAL HERNIA REPAIR     Patient states that it was a double hernia surgery   FOOT SURGERY     HIATAL HERNIA REPAIR     knee surgeries     neck surgeries     UPPER GASTROINTESTINAL ENDOSCOPY  1980's    FAMILY HISTORY: Family History  Problem Relation Age of Onset   Lung disease Sister      SOCIAL HISTORY: Social History   Socioeconomic History   Marital status: Married    Spouse name: Not on file   Number of children: Not on file   Years of education: Not on file   Highest education level: Not on file  Occupational History   Not on file  Tobacco Use   Smoking status: Former    Current packs/day: 0.00    Average packs/day: 1.5 packs/day for 16.0 years (24.0 ttl pk-yrs)    Types: Cigarettes    Start date: 10/08/1976    Quit date: 10/08/1992    Years since quitting: 32.0    Passive exposure: Never   Smokeless tobacco: Never  Vaping Use   Vaping status: Never Used  Substance and Sexual Activity   Alcohol use: No   Drug use: No   Sexual activity: Not on file  Other Topics Concern   Not on file  Social History Narrative   Not on file   Social Drivers of Health   Financial Resource Strain: Not on file  Food Insecurity: Not on file  Transportation Needs: Not on file  Physical Activity: Not on file  Stress: Not on file  Social Connections: Not on file  Intimate Partner Violence: Not on file    PHYSICAL EXAM  GENERAL EXAM/CONSTITUTIONAL: Vitals:  Vitals:   10/16/24 1302  BP: (!) 148/76  Pulse: 88  SpO2: 98%  Weight: 277 lb 8 oz (125.9 kg)  Height: 6' 3 (1.905 m)   Body mass index is 34.69 kg/m. Wt Readings from Last 3 Encounters:  10/16/24 277 lb 8 oz (125.9 kg)  09/26/24 276 lb 6.4 oz (125.4 kg)  03/27/24 275 lb 12.8 oz (125.1 kg)   Patient is in no distress; well developed, nourished and groomed; neck is supple  MUSCULOSKELETAL: Gait, strength, tone, movements noted in Neurologic exam below  NEUROLOGIC: MENTAL STATUS:      No data to display            10/16/2024    1:06 PM  Montreal Cognitive Assessment   Visuospatial/ Executive (0/5) 4  Naming (0/3) 3  Attention: Read list of digits (0/2) 2  Attention: Read list of letters (0/1) 1  Attention: Serial 7 subtraction starting at 100 (0/3) 3  Language: Repeat phrase (0/2)  2  Language : Fluency (0/1) 0  Abstraction (0/2) 2  Delayed Recall (0/5) 3  Orientation (0/6) 6  Total 26  Adjusted Score (based on education) 26    awake, alert, oriented to person, place and time recent and remote memory intact normal attention and concentration language fluent, comprehension intact, naming intact fund of knowledge appropriate  CRANIAL NERVE:  2nd, 3rd, 4th, 6th- visual fields full to confrontation, extraocular muscles intact, no nystagmus 5th - facial sensation symmetric 7th - facial strength symmetric 8th - hearing intact 9th - palate elevates symmetrically, uvula midline 11th - shoulder shrug symmetric 12th - tongue protrusion midline  MOTOR:  normal bulk and tone, full strength in the BUE, BLE  SENSORY:  normal and symmetric to light touch  COORDINATION:  finger-nose-finger, fine finger movements normal  GAIT/STATION:  normal   DIAGNOSTIC DATA (LABS, IMAGING, TESTING) - I reviewed patient records, labs, notes, testing and imaging myself where available.  Lab Results  Component Value Date   WBC 6.6 09/28/2024   HGB 15.4 09/28/2024   HCT 47.6 09/28/2024   MCV 90.2 09/28/2024   PLT 221 09/28/2024      Component Value Date/Time   NA 139 09/28/2024 1443   NA 143 03/19/2021 0805   K 4.5 09/28/2024 1443   CL 105 09/28/2024 1443   CO2 23 09/28/2024 1443   GLUCOSE 220 (H) 09/28/2024 1443   BUN 16 09/28/2024 1443   BUN 15 03/19/2021 0805   CREATININE 0.84 09/28/2024 1443   CALCIUM 9.6 09/28/2024 1443   PROT 6.7 09/28/2024 1443   PROT 7.1 03/19/2021 0805   ALBUMIN 4.9 03/19/2021 0805   AST 22 09/28/2024 1443   ALT 30 09/28/2024 1443   ALKPHOS 72 03/19/2021 0805   BILITOT 0.6 09/28/2024 1443   BILITOT 0.7 03/19/2021 0805   GFRNONAA 95 07/07/2020 0811   GFRAA 110 07/07/2020 0811   No results found for: CHOL, HDL,  LDLCALC, LDLDIRECT, TRIG, CHOLHDL No results found for: YHAJ8R No results found for: VITAMINB12 No results  found for: TSH    ASSESSMENT AND PLAN  63 y.o. year old male with hypertension, hyperlipidemia, diabetes mellitus, hypothyroidism, who is presenting with memory loss worse in the past year.  Subjective memory impairment Reports gradual decline in memory over the past 5 years, with worsening in the last few years. No significant interference with daily activities. MOCA score of 26, indicating normal cognition. Differential diagnosis includes mild cognitive impairment versus pseudodementia from ongoing depression, but current assessment suggests normal cognition. Stress and possible pseudo-dementia due to depression considered as contributing factors. 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 - Continue B12 supplementation at 1000 mcg daily.  Chronic pain syndrome with chronic joint, back, neck, and knee pain, status post cervical fusion and bilateral knee procedures Chronic pain in multiple joints, including back, neck, and knees, exacerbated by physical activity. Gabapentin  may contribute to memory issues. Cymbalta considered for dual benefit of pain management and mood stabilization. - Prescribed Cymbalta 30 mg daily for pain management and mood stabilization. - Recommended water exercises to alleviate joint pain. - Advised limiting gabapentin  to 300 mg at night.  Insomnia, short sleep duration Reports average sleep duration of 5.5 hours per night, potentially insufficient for optimal health. Stress and anxiety related to spouse's mental illness may contribute to sleep disturbances.   Depressive symptoms and stress related to spouse's mental illness Significant stress and depressive symptoms due to spouse's mental illness, including delusions and hallucinations. Stress impacts daily life and well-being. Declined therapy but open to discussing with spouse's primary care provider about potential treatment options for spouse with antipsychotic such as quetiapine (which can help spouse  lack of sleep). - Prescribed Cymbalta 30 mg daily to address depressive symptoms and stress. - Encouraged discussion with spouse's primary care provider about potential treatment options for spouse's mental illness.     1. Cognitive complaints with normal exam   2. Depression, unspecified depression type   3. Anxiety   4. Marital problems      Patient Instructions  Continue current medications Start Cymbalta 30 mg daily Please call for updates Follow-up with PCP to discuss treatment option for spouse (since he is the same PCP for both patient and spouse) Encourage physical activities, mainly water exercise Return as needed  No orders of the defined types were placed in this encounter.   Meds ordered this encounter  Medications   DULoxetine (CYMBALTA) 30 MG capsule    Sig: Take 1 capsule (30 mg total) by mouth daily.    Dispense:  90 capsule    Refill:  1    Return if symptoms worsen or fail to improve.  I personally spent a total of 75 minutes in the care of the patient today including preparing to see the patient, getting/reviewing separately obtained history, performing a medically appropriate exam/evaluation, counseling and educating, documenting clinical information in the EHR, and mainly counseling patient about wife mental health, need for treatment for spouse and therapy for both patient and spouse or at least individual therapy for patient.   Pastor Falling, MD 10/16/2024, 2:37 PM  [facility] [address], [address] [place], [place] [address] [phone]

## 2024-12-06 ENCOUNTER — Other Ambulatory Visit: Payer: Self-pay | Admitting: Rheumatology

## 2024-12-06 NOTE — Telephone Encounter (Signed)
 Last Fill: 09/05/2024  Next Visit: 03/27/2025  Last Visit: 09/26/2024  DX: Neuralgia   Current Dose per office note on 09/26/2024: gabapentin  200 mg at bedtime.   Okay to refill gabapentin ?

## 2024-12-26 ENCOUNTER — Other Ambulatory Visit: Payer: Self-pay

## 2024-12-26 MED ORDER — CYCLOBENZAPRINE HCL 10 MG PO TABS: ORAL_TABLET | ORAL | 0 refills | Status: AC

## 2024-12-26 NOTE — Telephone Encounter (Signed)
 Patient contacted the office and requests a refill of Flexeril  be sent to [facility].   Last Fill: 06/05/2024  Next Visit: 03/27/2025  Last Visit: 09/26/2024  Dx: not mentioned  Current Dose per office note on 09/26/2024: not mentioned  Okay to refill Flexeril ?

## 2025-03-27 ENCOUNTER — Ambulatory Visit: Admitting: Rheumatology
